# Patient Record
Sex: Female | Born: 1975 | Race: White | Hispanic: No | Marital: Married | Smoking: Current every day smoker
Health system: Southern US, Community
[De-identification: ages and names within clinical notes are randomized; demographics above are authoritative.]

## PROBLEM LIST (undated history)

## (undated) DIAGNOSIS — F419 Anxiety disorder, unspecified: Secondary | ICD-10-CM

## (undated) DIAGNOSIS — I10 Essential (primary) hypertension: Secondary | ICD-10-CM

## (undated) HISTORY — PX: GASTRIC BYPASS: SHX52

## (undated) HISTORY — PX: OVARIAN CYST REMOVAL: SHX89

---

## 2015-09-01 ENCOUNTER — Ambulatory Visit
Admission: RE | Admit: 2015-09-01 | Discharge: 2015-09-01 | Disposition: A | Discharge status: Home / Self Care | Payer: No Typology Code available for payment source | Source: Ambulatory Visit | Attending: *Deleted | Admitting: *Deleted

## 2015-09-01 ENCOUNTER — Other Ambulatory Visit: Payer: Self-pay | Admitting: *Deleted

## 2015-09-01 DIAGNOSIS — R7611 Nonspecific reaction to tuberculin skin test without active tuberculosis: Secondary | ICD-10-CM

## 2015-10-16 ENCOUNTER — Encounter (HOSPITAL_COMMUNITY): Payer: Self-pay | Admitting: Emergency Medicine

## 2015-10-16 ENCOUNTER — Inpatient Hospital Stay (HOSPITAL_COMMUNITY)
Admission: EM | Admit: 2015-10-16 | Discharge: 2015-10-20 | DRG: 418 | Disposition: A | Discharge status: Home / Self Care | Payer: BLUE CROSS/BLUE SHIELD | Attending: Internal Medicine | Admitting: Internal Medicine

## 2015-10-16 ENCOUNTER — Encounter (HOSPITAL_COMMUNITY): Payer: Self-pay

## 2015-10-16 ENCOUNTER — Ambulatory Visit (HOSPITAL_COMMUNITY)
Admission: EM | Admit: 2015-10-16 | Discharge: 2015-10-16 | Disposition: A | Discharge status: Nursing Home (Includes ICF) | Payer: BLUE CROSS/BLUE SHIELD

## 2015-10-16 DIAGNOSIS — K851 Biliary acute pancreatitis without necrosis or infection: Principal | ICD-10-CM | POA: Diagnosis present

## 2015-10-16 DIAGNOSIS — K8020 Calculus of gallbladder without cholecystitis without obstruction: Secondary | ICD-10-CM

## 2015-10-16 DIAGNOSIS — F17210 Nicotine dependence, cigarettes, uncomplicated: Secondary | ICD-10-CM | POA: Diagnosis present

## 2015-10-16 DIAGNOSIS — F329 Major depressive disorder, single episode, unspecified: Secondary | ICD-10-CM

## 2015-10-16 DIAGNOSIS — F32A Depression, unspecified: Secondary | ICD-10-CM

## 2015-10-16 DIAGNOSIS — Z881 Allergy status to other antibiotic agents status: Secondary | ICD-10-CM

## 2015-10-16 DIAGNOSIS — K810 Acute cholecystitis: Secondary | ICD-10-CM | POA: Diagnosis present

## 2015-10-16 DIAGNOSIS — E876 Hypokalemia: Secondary | ICD-10-CM | POA: Diagnosis present

## 2015-10-16 DIAGNOSIS — Z9884 Bariatric surgery status: Secondary | ICD-10-CM

## 2015-10-16 DIAGNOSIS — R109 Unspecified abdominal pain: Secondary | ICD-10-CM

## 2015-10-16 DIAGNOSIS — D649 Anemia, unspecified: Secondary | ICD-10-CM | POA: Diagnosis present

## 2015-10-16 DIAGNOSIS — R7401 Elevation of levels of liver transaminase levels: Secondary | ICD-10-CM

## 2015-10-16 DIAGNOSIS — R1011 Right upper quadrant pain: Secondary | ICD-10-CM

## 2015-10-16 DIAGNOSIS — Z79899 Other long term (current) drug therapy: Secondary | ICD-10-CM

## 2015-10-16 DIAGNOSIS — R740 Nonspecific elevation of levels of transaminase and lactic acid dehydrogenase [LDH]: Secondary | ICD-10-CM | POA: Diagnosis present

## 2015-10-16 DIAGNOSIS — K859 Acute pancreatitis without necrosis or infection, unspecified: Secondary | ICD-10-CM | POA: Diagnosis present

## 2015-10-16 DIAGNOSIS — Z419 Encounter for procedure for purposes other than remedying health state, unspecified: Secondary | ICD-10-CM

## 2015-10-16 DIAGNOSIS — Z880 Allergy status to penicillin: Secondary | ICD-10-CM

## 2015-10-16 DIAGNOSIS — Z720 Tobacco use: Secondary | ICD-10-CM

## 2015-10-16 DIAGNOSIS — F419 Anxiety disorder, unspecified: Secondary | ICD-10-CM | POA: Diagnosis present

## 2015-10-16 DIAGNOSIS — K858 Other acute pancreatitis without necrosis or infection: Secondary | ICD-10-CM

## 2015-10-16 HISTORY — DX: Anxiety disorder, unspecified: F41.9

## 2015-10-16 LAB — CBC
HCT: 40.7 % (ref 36.0–46.0)
HEMOGLOBIN: 13.4 g/dL (ref 12.0–15.0)
MCH: 32.3 pg (ref 26.0–34.0)
MCHC: 32.9 g/dL (ref 30.0–36.0)
MCV: 98.1 fL (ref 78.0–100.0)
PLATELETS: 213 10*3/uL (ref 150–400)
RBC: 4.15 MIL/uL (ref 3.87–5.11)
RDW: 15.3 % (ref 11.5–15.5)
WBC: 11.1 10*3/uL — ABNORMAL HIGH (ref 4.0–10.5)

## 2015-10-16 LAB — POCT URINALYSIS DIP (DEVICE)
GLUCOSE, UA: NEGATIVE mg/dL
Hgb urine dipstick: NEGATIVE
Ketones, ur: NEGATIVE mg/dL
Leukocytes, UA: NEGATIVE
Nitrite: NEGATIVE
PROTEIN: 30 mg/dL — AB
SPECIFIC GRAVITY, URINE: 1.02 (ref 1.005–1.030)
UROBILINOGEN UA: 2 mg/dL — AB (ref 0.0–1.0)
pH: 7.5 (ref 5.0–8.0)

## 2015-10-16 LAB — COMPREHENSIVE METABOLIC PANEL
ALBUMIN: 4.3 g/dL (ref 3.5–5.0)
ALK PHOS: 130 U/L — AB (ref 38–126)
ALT: 320 U/L — ABNORMAL HIGH (ref 14–54)
ANION GAP: 6 (ref 5–15)
AST: 224 U/L — ABNORMAL HIGH (ref 15–41)
BILIRUBIN TOTAL: 1.1 mg/dL (ref 0.3–1.2)
BUN: 9 mg/dL (ref 6–20)
CALCIUM: 9.1 mg/dL (ref 8.9–10.3)
CO2: 26 mmol/L (ref 22–32)
CREATININE: 0.71 mg/dL (ref 0.44–1.00)
Chloride: 100 mmol/L — ABNORMAL LOW (ref 101–111)
GFR calc non Af Amer: >60 mL/min (ref >60)
GLUCOSE: 106 mg/dL — AB (ref 65–99)
Potassium: 3.4 mmol/L — ABNORMAL LOW (ref 3.5–5.1)
Sodium: 132 mmol/L — ABNORMAL LOW (ref 135–145)
TOTAL PROTEIN: 7.3 g/dL (ref 6.5–8.1)

## 2015-10-16 LAB — URINALYSIS, ROUTINE W REFLEX MICROSCOPIC
BILIRUBIN URINE: NEGATIVE
Glucose, UA: NEGATIVE mg/dL
Hgb urine dipstick: NEGATIVE
KETONES UR: 15 mg/dL — AB
Leukocytes, UA: NEGATIVE
NITRITE: NEGATIVE
PROTEIN: NEGATIVE mg/dL
SPECIFIC GRAVITY, URINE: 1.016 (ref 1.005–1.030)
pH: 7 (ref 5.0–8.0)

## 2015-10-16 LAB — I-STAT BETA HCG BLOOD, ED (MC, WL, AP ONLY)

## 2015-10-16 LAB — LIPASE, BLOOD: Lipase: 1269 U/L — ABNORMAL HIGH (ref 11–51)

## 2015-10-16 NOTE — ED Triage Notes (Signed)
Pt complaining of mid abdominal pain and back pain. Pt states seen by UC today, sent here for gall bladder follow up. Pt states some nausea, no vomiting. Pt denies any urinary symptoms.

## 2015-10-16 NOTE — ED Triage Notes (Signed)
[facility] with a complaint of abdominal pain and lower back pain that started yesterday. The patient denied any dysuria or frequency.

## 2015-10-16 NOTE — ED Provider Notes (Signed)
[facility]    CSN: [id number] Arrival date & time: 10/16/15  1814  First Provider Contact:  First MD Initiated Contact with Patient 10/16/15 1904        History   Chief Complaint Chief Complaint  Patient presents with  . Abdominal Pain    HPI Anita Harrington is a 40 y.o. female.    Abdominal Pain  Pain location:  RUQ Pain quality: cramping   Pain radiates to:  Back Pain severity:  Moderate Duration:  2 days Progression:  Worsening Chronicity:  New Context: previous surgery   Relieved by:  None tried Worsened by:  Palpation Ineffective treatments:  None tried Associated symptoms: nausea   Associated symptoms: no belching, no dysuria and no fever     Past Medical History:  Diagnosis Date  . Anxiety     There are no active problems to display for this patient.   Past Surgical History:  Procedure Laterality Date  . GASTRIC BYPASS    . OVARIAN CYST REMOVAL      OB History    No data available       Home Medications    Prior to Admission medications   Medication Sig Start Date End Date Taking? Authorizing Provider  escitalopram (LEXAPRO) 20 MG tablet Take 20 mg by mouth daily.   Yes Historical Provider, MD  gabapentin (NEURONTIN) 300 MG capsule Take 300 mg by mouth 3 (three) times daily.   Yes Historical Provider, MD  progesterone (PROMETRIUM) 100 MG capsule Take 100 mg by mouth daily.   Yes Historical Provider, MD  traZODone (DESYREL) 100 MG tablet Take 200 mg by mouth at bedtime.   Yes Historical Provider, MD    Family History History reviewed. No pertinent family history.  Social History Social History  Substance Use Topics  . Smoking status: Current Every Day Smoker    Packs/day: 1.00    Years: 25.00  . Smokeless tobacco: Never Used  . Alcohol use No     Allergies   Levaquin [levofloxacin in d5w] and Penicillins   Review of Systems Review of Systems  Constitutional: Positive for appetite change. Negative for fever.    Respiratory: Negative.   Cardiovascular: Negative.   Gastrointestinal: Positive for abdominal pain and nausea.  Genitourinary: Negative.  Negative for dysuria.  All other systems reviewed and are negative.    Physical Exam Triage Vital Signs ED Triage Vitals  Enc Vitals Group     BP 10/16/15 1831 127/89     Pulse Rate 10/16/15 1831 82     Resp 10/16/15 1831 16     Temp 10/16/15 1831 98.1 F (36.7 C)     Temp Source 10/16/15 1831 Oral     SpO2 10/16/15 1831 100 %     Weight --      Height --      Head Circumference --      Peak Flow --      Pain Score 10/16/15 1849 7     Pain Loc --      Pain Edu? --      Excl. in GC? --    No data found.   Updated Vital Signs BP 127/89 (BP Location: Right Arm)   Pulse 82   Temp 98.1 F (36.7 C) (Oral)   Resp 16   LMP 10/09/2015 (Exact Date)   SpO2 100%   Visual Acuity Right Eye Distance:   Left Eye Distance:   Bilateral Distance:    Right Eye Near:  Left Eye Near:    Bilateral Near:     Physical Exam  Constitutional: She appears well-developed and well-nourished.  Cardiovascular: Normal rate, regular rhythm, normal heart sounds and intact distal pulses.   Pulmonary/Chest: Effort normal and breath sounds normal.  Abdominal: Bowel sounds are decreased. There is tenderness in the right upper quadrant. There is positive Murphy's sign. There is no rebound, no guarding and no CVA tenderness.    Nursing note and vitals reviewed.    UC Treatments / Results  Labs (all labs ordered are listed, but only abnormal results are displayed) Labs Reviewed  POCT URINALYSIS DIP (DEVICE) - Abnormal; Notable for the following:       Result Value   Bilirubin Urine SMALL (*)    Protein, ur 30 (*)    Urobilinogen, UA 2.0 (*)    All other components within normal limits    EKG  EKG Interpretation None       Radiology No results found.  Procedures Procedures (including critical care time)  Medications Ordered in  UC Medications - No data to display   Initial Impression / Assessment and Plan / UC Course  I have reviewed the triage vital signs and the nursing notes.  Pertinent labs & imaging results that were available during my care of the patient were reviewed by me and considered in my medical decision making (see chart for details).  Clinical Course   Sent for eval of acute ruq pain with radiation to right back, u/a neg   Final Clinical Impressions(s) / UC Diagnoses   Final diagnoses:  None    New Prescriptions New Prescriptions   No medications on file     Linna HoffJames D Kindl, MD 10/16/15 1931

## 2015-10-16 NOTE — ED Notes (Signed)
Called Pt for reassessment at 2215, no response

## 2015-10-17 ENCOUNTER — Encounter (HOSPITAL_COMMUNITY): Payer: Self-pay | Admitting: General Practice

## 2015-10-17 ENCOUNTER — Observation Stay (HOSPITAL_COMMUNITY): Payer: BLUE CROSS/BLUE SHIELD

## 2015-10-17 ENCOUNTER — Emergency Department (HOSPITAL_COMMUNITY): Payer: BLUE CROSS/BLUE SHIELD

## 2015-10-17 DIAGNOSIS — K859 Acute pancreatitis without necrosis or infection, unspecified: Secondary | ICD-10-CM | POA: Diagnosis present

## 2015-10-17 DIAGNOSIS — E876 Hypokalemia: Secondary | ICD-10-CM | POA: Diagnosis present

## 2015-10-17 DIAGNOSIS — R740 Nonspecific elevation of levels of transaminase and lactic acid dehydrogenase [LDH]: Secondary | ICD-10-CM

## 2015-10-17 DIAGNOSIS — K858 Other acute pancreatitis without necrosis or infection: Secondary | ICD-10-CM | POA: Diagnosis not present

## 2015-10-17 DIAGNOSIS — K8020 Calculus of gallbladder without cholecystitis without obstruction: Secondary | ICD-10-CM | POA: Diagnosis present

## 2015-10-17 DIAGNOSIS — F419 Anxiety disorder, unspecified: Secondary | ICD-10-CM | POA: Diagnosis present

## 2015-10-17 DIAGNOSIS — Z720 Tobacco use: Secondary | ICD-10-CM

## 2015-10-17 DIAGNOSIS — Z881 Allergy status to other antibiotic agents status: Secondary | ICD-10-CM | POA: Diagnosis not present

## 2015-10-17 DIAGNOSIS — F329 Major depressive disorder, single episode, unspecified: Secondary | ICD-10-CM | POA: Diagnosis present

## 2015-10-17 DIAGNOSIS — R7401 Elevation of levels of liver transaminase levels: Secondary | ICD-10-CM

## 2015-10-17 DIAGNOSIS — D649 Anemia, unspecified: Secondary | ICD-10-CM | POA: Diagnosis present

## 2015-10-17 DIAGNOSIS — Z9884 Bariatric surgery status: Secondary | ICD-10-CM | POA: Diagnosis not present

## 2015-10-17 DIAGNOSIS — Z79899 Other long term (current) drug therapy: Secondary | ICD-10-CM | POA: Diagnosis not present

## 2015-10-17 DIAGNOSIS — K851 Biliary acute pancreatitis without necrosis or infection: Secondary | ICD-10-CM | POA: Diagnosis present

## 2015-10-17 DIAGNOSIS — Z880 Allergy status to penicillin: Secondary | ICD-10-CM | POA: Diagnosis not present

## 2015-10-17 DIAGNOSIS — F17210 Nicotine dependence, cigarettes, uncomplicated: Secondary | ICD-10-CM | POA: Diagnosis present

## 2015-10-17 DIAGNOSIS — K810 Acute cholecystitis: Secondary | ICD-10-CM | POA: Diagnosis present

## 2015-10-17 DIAGNOSIS — F32A Depression, unspecified: Secondary | ICD-10-CM

## 2015-10-17 LAB — BASIC METABOLIC PANEL
ANION GAP: 7 (ref 5–15)
BUN: 9 mg/dL (ref 6–20)
CHLORIDE: 104 mmol/L (ref 101–111)
CO2: 25 mmol/L (ref 22–32)
Calcium: 8.8 mg/dL — ABNORMAL LOW (ref 8.9–10.3)
Creatinine, Ser: 0.62 mg/dL (ref 0.44–1.00)
GFR calc Af Amer: >60 mL/min (ref >60)
GLUCOSE: 112 mg/dL — AB (ref 65–99)
POTASSIUM: 3.3 mmol/L — AB (ref 3.5–5.1)
Sodium: 136 mmol/L (ref 135–145)

## 2015-10-17 LAB — CBC
HEMATOCRIT: 35.2 % — AB (ref 36.0–46.0)
HEMOGLOBIN: 11.3 g/dL — AB (ref 12.0–15.0)
MCH: 31.5 pg (ref 26.0–34.0)
MCHC: 32.1 g/dL (ref 30.0–36.0)
MCV: 98.1 fL (ref 78.0–100.0)
Platelets: 155 10*3/uL (ref 150–400)
RBC: 3.59 MIL/uL — ABNORMAL LOW (ref 3.87–5.11)
RDW: 15.6 % — ABNORMAL HIGH (ref 11.5–15.5)
WBC: 8 10*3/uL (ref 4.0–10.5)

## 2015-10-17 LAB — HEPATIC FUNCTION PANEL
ALBUMIN: 3.5 g/dL (ref 3.5–5.0)
ALK PHOS: 106 U/L (ref 38–126)
ALT: 203 U/L — ABNORMAL HIGH (ref 14–54)
AST: 102 U/L — AB (ref 15–41)
BILIRUBIN TOTAL: 0.7 mg/dL (ref 0.3–1.2)
Bilirubin, Direct: 0.2 mg/dL (ref 0.1–0.5)
Indirect Bilirubin: 0.5 mg/dL (ref 0.3–0.9)
Total Protein: 5.9 g/dL — ABNORMAL LOW (ref 6.5–8.1)

## 2015-10-17 LAB — LIPASE, BLOOD: LIPASE: 750 U/L — AB (ref 11–51)

## 2015-10-17 MED ORDER — SODIUM CHLORIDE 0.9 % IV BOLUS (SEPSIS)
1000.0000 mL | Freq: Once | INTRAVENOUS | Status: AC
  Administered 2015-10-17: 1000 mL via INTRAVENOUS

## 2015-10-17 MED ORDER — ESCITALOPRAM OXALATE 10 MG PO TABS
20.0000 mg | ORAL_TABLET | Freq: Every day | ORAL | Status: DC
  Administered 2015-10-17 – 2015-10-20 (×4): 20 mg via ORAL
  Filled 2015-10-17 (×4): qty 2

## 2015-10-17 MED ORDER — SODIUM CHLORIDE 0.9 % IV SOLN
INTRAVENOUS | Status: AC
  Administered 2015-10-17 – 2015-10-18 (×2): 1000 mL via INTRAVENOUS

## 2015-10-17 MED ORDER — ACETAMINOPHEN 650 MG RE SUPP: 650.0000 mg | Freq: Four times a day (QID) | RECTAL | Status: DC | PRN

## 2015-10-17 MED ORDER — MORPHINE SULFATE (PF) 4 MG/ML IV SOLN
4.0000 mg | INTRAVENOUS | Status: DC | PRN
  Administered 2015-10-17 – 2015-10-18 (×6): 4 mg via INTRAVENOUS
  Filled 2015-10-17 (×11): qty 1

## 2015-10-17 MED ORDER — MORPHINE SULFATE (PF) 4 MG/ML IV SOLN
4.0000 mg | Freq: Once | INTRAVENOUS | Status: AC
  Administered 2015-10-17: 4 mg via INTRAVENOUS
  Filled 2015-10-17: qty 1

## 2015-10-17 MED ORDER — ONDANSETRON HCL 4 MG/2ML IJ SOLN: 4.0000 mg | Freq: Four times a day (QID) | INTRAMUSCULAR | Status: DC | PRN

## 2015-10-17 MED ORDER — ONDANSETRON HCL 4 MG PO TABS: 4.0000 mg | ORAL_TABLET | Freq: Four times a day (QID) | ORAL | Status: DC | PRN

## 2015-10-17 MED ORDER — ONDANSETRON HCL 4 MG/2ML IJ SOLN
4.0000 mg | Freq: Once | INTRAMUSCULAR | Status: AC
  Administered 2015-10-17: 4 mg via INTRAVENOUS
  Filled 2015-10-17: qty 2

## 2015-10-17 MED ORDER — NICOTINE 21 MG/24HR TD PT24
21.0000 mg | MEDICATED_PATCH | Freq: Once | TRANSDERMAL | Status: AC
  Administered 2015-10-17: 21 mg via TRANSDERMAL
  Filled 2015-10-17: qty 1

## 2015-10-17 MED ORDER — TRAZODONE HCL 100 MG PO TABS
200.0000 mg | ORAL_TABLET | Freq: Every day | ORAL | Status: DC
  Administered 2015-10-17 – 2015-10-20 (×3): 200 mg via ORAL
  Filled 2015-10-17 (×5): qty 2

## 2015-10-17 MED ORDER — ACETAMINOPHEN 10 MG/ML IV SOLN
1000.0000 mg | Freq: Once | INTRAVENOUS | Status: AC
  Administered 2015-10-17: 1000 mg via INTRAVENOUS
  Filled 2015-10-17: qty 100

## 2015-10-17 MED ORDER — MORPHINE SULFATE (PF) 2 MG/ML IV SOLN
2.0000 mg | INTRAVENOUS | Status: DC | PRN
  Administered 2015-10-17 (×2): 2 mg via INTRAVENOUS
  Filled 2015-10-17 (×2): qty 1

## 2015-10-17 MED ORDER — SODIUM CHLORIDE 0.9 % IV SOLN
500.0000 mg | Freq: Four times a day (QID) | INTRAVENOUS | Status: DC
  Filled 2015-10-17 (×2): qty 500

## 2015-10-17 MED ORDER — GABAPENTIN 300 MG PO CAPS
300.0000 mg | ORAL_CAPSULE | Freq: Three times a day (TID) | ORAL | Status: DC
  Administered 2015-10-17 – 2015-10-20 (×11): 300 mg via ORAL
  Filled 2015-10-17 (×11): qty 1

## 2015-10-17 MED ORDER — ACETAMINOPHEN 325 MG PO TABS
650.0000 mg | ORAL_TABLET | Freq: Four times a day (QID) | ORAL | Status: DC | PRN
  Administered 2015-10-17 – 2015-10-20 (×5): 650 mg via ORAL
  Filled 2015-10-17 (×5): qty 2

## 2015-10-17 MED ORDER — SODIUM CHLORIDE 0.9 % IV SOLN
500.0000 mg | Freq: Four times a day (QID) | INTRAVENOUS | Status: DC
  Administered 2015-10-17 – 2015-10-20 (×11): 500 mg via INTRAVENOUS
  Filled 2015-10-17 (×15): qty 500

## 2015-10-17 NOTE — Progress Notes (Signed)
Pt is not getting relief from 2 mg of morphine, she was given 4 mg in ED and that lasted about 2.5 hrs her pain is back to 8-9 on 0-10 scale. Will give this information to on coming day nurse.

## 2015-10-17 NOTE — Progress Notes (Addendum)
Pharmacy Antibiotic Note  Anita Harrington is a 40 y.o. female admitted on 10/16/2015 with abdominal pain x1 day. Empiric Primaxin for intra-abdominal infx/poss. pancreatitis. Lipase elevated. Gallstones noted on abdominal US. MRCP today. Pt currently afebrile, WBC WNL.   Allergy to PCN noted (rash). Clarified with pt - occurred in childhood. Does not recall symptoms of anaphylaxis.    Plan: -Start Primaxin 500mg  q6h  -F/U GI workup -Monitor renal fcn, clinical status, LOT   Height: 5\' 2"  (157.5 cm) Weight: 136 lb 3.9 oz (61.8 kg) IBW/kg (Calculated) : 50.1  Temp (24hrs), Avg:98.3 F (36.8 C), Min:98.1 F (36.7 C), Max:98.5 F (36.9 C)   Recent Labs Lab 10/16/15 2001 10/17/15 0655  WBC 11.1* 8.0  CREATININE 0.71 0.62    Estimated Creatinine Clearance: 80.9 mL/min (by C-G formula based on SCr of 0.8 mg/dL).    Allergies  Allergen Reactions  . Levaquin [Levofloxacin In D5w] Rash    During childhood. Pt unsure of details.  . Penicillins Rash    During childhood. Pt unsure of details.    Antimicrobials this admission: Primaxin 8/22 >>  Thank you for allowing pharmacy to be a part of this patient's care.  Sherle Poeob Rhyder Koegel, PharmD Clinical Pharmacist 8:42 AM, 10/17/2015

## 2015-10-17 NOTE — H&P (Addendum)
History and Physical    Anita Harrington [id number] DOB: Nov 22, 1975 DOA: 10/16/2015  PCP: No PCP Per Patient  Patient coming from: Home.  Chief Complaint: Abdominal pain.  HPI: Anita Harrington is a 40 y.o. female with depression presents to the ER because of abdominal pain. Patient's abdominal pain started night before this. Has been having some nausea denies any vomiting or diarrhea. Denies any fever or chills. Labs in the ER showed elevated lipase more than 1200 and LFTs elevated. Sonogram shows gallstones. Patient is being admitted for gallstone pancreatitis. Denies any alcohol abuse.   ED Course: Was started on IV fluids. Sonogram shows gallstones.  Review of Systems: As per HPI, rest all negative.   Past Medical History:  Diagnosis Date  . Anxiety     Past Surgical History:  Procedure Laterality Date  . GASTRIC BYPASS    . OVARIAN CYST REMOVAL       reports that she has been smoking.  She has a 25.00 pack-year smoking history. She has never used smokeless tobacco. She reports that she does not drink alcohol or use drugs.  Allergies  Allergen Reactions  . Levaquin [Levofloxacin In D5w] Rash  . Penicillins Rash    Family History  Problem Relation Age of Onset  . Diabetes Mother   . Diabetes Father     Prior to Admission medications   Medication Sig Start Date End Date Taking? Authorizing Provider  escitalopram (LEXAPRO) 20 MG tablet Take 20 mg by mouth daily.   Yes Historical Provider, MD  gabapentin (NEURONTIN) 300 MG capsule Take 300 mg by mouth 3 (three) times daily.   Yes Historical Provider, MD  Multiple Vitamin (MULTIVITAMIN WITH MINERALS) TABS tablet Take 1 tablet by mouth daily.   Yes Historical Provider, MD  progesterone (PROMETRIUM) 100 MG capsule Take 100 mg by mouth daily.   Yes Historical Provider, MD  traZODone (DESYREL) 100 MG tablet Take 200 mg by mouth at bedtime.   Yes Historical Provider, MD    Physical Exam: Vitals:   10/17/15 0030  10/17/15 0130 10/17/15 0200 10/17/15 0307  BP: 130/83 129/72 138/81 129/74  Pulse: 88 79 68 65  Resp:    20  Temp:    98.5 F (36.9 C)  TempSrc:    Oral  SpO2: 100% 99% 100% 100%  Weight:    136 lb 3.9 oz (61.8 kg)  Height:    5\' 2"  (1.575 m)      Constitutional: Not in distress. Vitals:   10/17/15 0030 10/17/15 0130 10/17/15 0200 10/17/15 0307  BP: 130/83 129/72 138/81 129/74  Pulse: 88 79 68 65  Resp:    20  Temp:    98.5 F (36.9 C)  TempSrc:    Oral  SpO2: 100% 99% 100% 100%  Weight:    136 lb 3.9 oz (61.8 kg)  Height:    5\' 2"  (1.575 m)   Eyes: Anicteric no pallor. ENMT: No discharge from the ears eyes nose and mouth. Neck: No mass felt. No neck rigidity. Respiratory: No rhonchi or crepitations. Cardiovascular: S1-S2 heard. Abdomen: Mild epigastric tenderness no guarding or rigidity. Musculoskeletal: No edema. Skin: No rash. Neurologic: Alert awake oriented to time place and person. Moves all extremities. Psychiatric: Appears normal.   Labs on Admission: I have personally reviewed following labs and imaging studies  CBC:  Recent Labs Lab 10/16/15 2001  WBC 11.1*  HGB 13.4  HCT 40.7  MCV 98.1  PLT 213   Basic Metabolic Panel:  Recent Labs  Lab 10/16/15 2001  NA 132*  K 3.4*  CL 100*  CO2 26  GLUCOSE 106*  BUN 9  CREATININE 0.71  CALCIUM 9.1   GFR: Estimated Creatinine Clearance: 80.9 mL/min (by C-G formula based on SCr of 0.8 mg/dL). Liver Function Tests:  Recent Labs Lab 10/16/15 2001  AST 224*  ALT 320*  ALKPHOS 130*  BILITOT 1.1  PROT 7.3  ALBUMIN 4.3    Recent Labs Lab 10/16/15 2001  LIPASE 1,269*   No results for input(s): AMMONIA in the last 168 hours. Coagulation Profile: No results for input(s): INR, PROTIME in the last 168 hours. Cardiac Enzymes: No results for input(s): CKTOTAL, CKMB, CKMBINDEX, TROPONINI in the last 168 hours. BNP (last 3 results) No results for input(s): PROBNP in the last 8760  hours. HbA1C: No results for input(s): HGBA1C in the last 72 hours. CBG: No results for input(s): GLUCAP in the last 168 hours. Lipid Profile: No results for input(s): CHOL, HDL, LDLCALC, TRIG, CHOLHDL, LDLDIRECT in the last 72 hours. Thyroid Function Tests: No results for input(s): TSH, T4TOTAL, FREET4, T3FREE, THYROIDAB in the last 72 hours. Anemia Panel: No results for input(s): VITAMINB12, FOLATE, FERRITIN, TIBC, IRON, RETICCTPCT in the last 72 hours. Urine analysis:    Component Value Date/Time   COLORURINE YELLOW 10/16/2015 2121   APPEARANCEUR CLOUDY (A) 10/16/2015 2121   LABSPEC 1.016 10/16/2015 2121   PHURINE 7.0 10/16/2015 2121   GLUCOSEU NEGATIVE 10/16/2015 2121   HGBUR NEGATIVE 10/16/2015 2121   BILIRUBINUR NEGATIVE 10/16/2015 2121   KETONESUR 15 (A) 10/16/2015 2121   PROTEINUR NEGATIVE 10/16/2015 2121   UROBILINOGEN 2.0 (H) 10/16/2015 1853   NITRITE NEGATIVE 10/16/2015 2121   LEUKOCYTESUR NEGATIVE 10/16/2015 2121   Sepsis Labs: @LABRCNTIP (procalcitonin:4,lacticidven:4) )No results found for this or any previous visit (from the past 240 hour(s)).   Radiological Exams on Admission: [place] Abdomen Limited Ruq  Result Date: 10/17/2015 CLINICAL DATA:  Right upper quadrant abdominal pain EXAM: US ABDOMEN LIMITED - RIGHT UPPER QUADRANT COMPARISON:  None. FINDINGS: Gallbladder: Multiple shadowing gallstones. Gallbladder reportedly tender but there is partial distention and no wall thickening or pericholecystic edema Common bile duct: Diameter: 3 mm.  Where visualized, no filling defect. Liver: No focal lesion identified. Within normal limits in parenchymal echogenicity. IMPRESSION: Cholelithiasis. The gallbladder is tender but there is no distension or wall inflammation to suggest acute cholecystitis. Electronically Signed   By: Marnee SpringJonathon  Watts M.D.   On: 10/17/2015 01:30     Assessment/Plan Principal Problem:   Gallstone pancreatitis    1. Acute gallstone pancreatitis -  for now active patient nothing by mouth except medications and pain relief medications. Gently hydrate. I have ordered an MRCP to look for any obstructing stone or lesions. Consult gastroenterologist and surgery in a.m. For now I have placed patient on empiric antibiotics. Follow LFTs. 2. History of depression - continue present medications. 3. Tobacco abuse - tobacco cessation counseling requested.   DVT prophylaxis: SCDs. Code Status: Full code.  Family Communication: Discussed with patient.  Disposition Plan: Home.  Consults called: None.  Admission status: Observation.    Eduard ClosKAKRAKANDY,Rubin Dais N. MD [facility] Pager [phone].  If 7PM-7AM, please contact night-coverage [email or web address] Password [email or web address]  10/17/2015, 4:23 AM

## 2015-10-17 NOTE — Progress Notes (Signed)
Patient is upset regarding her POC. I did my best to update and inform her of today POC. Patient requesting  to see MD to discuss her pain regimen because morphine is not working. Rn reupdate POC. Attending MD is on his way to see pt. RN called MRI called and will transport pt to MRI soon.    Sim BoastHavy, RN

## 2015-10-17 NOTE — ED Notes (Signed)
Patient alert and oriented x4. Patient ambulatory. C/o right upper quadrant abdomen pain. 9/10 intermittant. Brooke Charity fundraiserN, [phone]

## 2015-10-17 NOTE — Progress Notes (Addendum)
Pt requesting to leave AMA notified [facility] attending of patients request am awaiting call back. Received call back was advised to allow patient to leave AMA with full understanding of risk vs benefits, it is undetermined at this time what patient will do. I will continue note after further assessing situation.   Patient has not brought the subject of leaving AMA up the last three times I have been in the room, I will leave it at that at this time.

## 2015-10-17 NOTE — Progress Notes (Signed)
Pt arrived around 0300 still having abdominal pain 7 on 0 -10 scale but otherwise appears normal with upper right quadrant abdominal pain on palpation, active bowel sounds all quadrants passing gas and had a bowel movement 10/16/15. Will continue to monitor.

## 2015-10-17 NOTE — Progress Notes (Signed)
Pt takes 100 mg of progesterone daily and she was concerned about getting this medication while in the hospital.

## 2015-10-17 NOTE — Consult Note (Signed)
[facility] Surgery Consult Note  Anita Harrington [birth year]  [id number].    Requesting MD: Dr. Shanon Brow Tat  Chief Complaint/Reason for Consult: Gallstone pancreatitis  HPI:  40 y/o female with PMH gastric bypass, anxiety, depression, polysubstance abuse previously on suboxone 10 years ago who presented to [facility] late 10/16/15 with 24h of abdominal pain associated with nausea. RUQ U/S in ED significant for cholelithiasis without biliary obstruction or dilation of CBD. Lipase at time of admission 1,269. AST/ALT 224/320. Alk Phos 130. T.bili is normal. WBC 11.1. Patient was started on IV abx, IVF, and admitted. MRCP negative for choledocholithiasis. General surgery was asked to consult.  Patient reports her pain started as back pain and later radiated to her LUQ and epigastrium. She denies fever, chills, CP, SOB, palpitations, vomiting, diarrhea, melena and hematochezia. She also complains of a sharp frontal HA. Past abdominal surgeries include ovarian cyst removal and gastric bypass surgery (5 years ago). Patient is frustrated about the way she has been treated thus far in her hospital stay based on her PMH polysubstance abuse. She denies use of narcotic medications, alcohol, and IV drugs. She smokes cigarettes. Her goal is to receive necessary treatment for her pancreatitis and then be able to be discharged from the hospital without narcotic medications. Reports that in order to finish her last 3 weeks of treatment at [facility] she cannot be on any narcotic medications. She denies use of blood thinning medications.  Hepatic Function Latest Ref Rng & Units 10/17/2015 10/16/2015  Total Protein 6.5 - 8.1 g/dL 5.9(L) 7.3  Albumin 3.5 - 5.0 g/dL 3.5 4.3  AST 15 - 41 U/L 102(H) 224(H)  ALT 14 - 54 U/L 203(H) 320(H)  Alk Phosphatase 38 - 126 U/L 106 130(H)  Total Bilirubin 0.3 - 1.2 mg/dL 0.7 1.1  Bilirubin, Direct 0.1 - 0.5 mg/dL 0.2 -   ROS: All systems reviewed and otherwise negative except for  as above  Family History  Problem Relation Age of Onset  . Diabetes Mother   . Diabetes Father     Past Medical History:  Diagnosis Date  . Anxiety     Past Surgical History:  Procedure Laterality Date  . GASTRIC BYPASS    . OVARIAN CYST REMOVAL      Social History:  reports that she has been smoking.  She has a 25.00 pack-year smoking history. She has never used smokeless tobacco. She reports that she does not drink alcohol or use drugs.  Allergies:  Allergies  Allergen Reactions  . Levaquin [Levofloxacin In D5w] Rash    During childhood. Pt unsure of details.  . Penicillins Rash    During childhood. Pt unsure of details.    Medications Prior to Admission  Medication Sig Dispense Refill  . escitalopram (LEXAPRO) 20 MG tablet Take 20 mg by mouth daily.    Marland Kitchen gabapentin (NEURONTIN) 300 MG capsule Take 300 mg by mouth 3 (three) times daily.    . Multiple Vitamin (MULTIVITAMIN WITH MINERALS) TABS tablet Take 1 tablet by mouth daily.    . progesterone (PROMETRIUM) 100 MG capsule Take 100 mg by mouth daily.    . traZODone (DESYREL) 100 MG tablet Take 200 mg by mouth at bedtime.      Blood pressure 122/63, pulse 62, temperature 98.4 F (36.9 C), temperature source Oral, resp. rate 20, height '5\' 2"'$  (1.575 m), weight 61.8 kg (136 lb 3.9 oz), last menstrual period 10/09/2015, SpO2 100 %. Physical Exam: General: pleasant, overweight white female who is laying  in bed in NAD HEENT: head is normocephalic, atraumatic. Heart: regular, rate, and rhythm.  No obvious murmurs, gallops, or rubs noted.  Palpable pedal pulses bilaterally Lungs: CTAB, no wheezes, rhonchi, or rales noted.  Respiratory effort nonlabored Abd: soft, TTP of epigastrium and LUQ - with brief inspiratory arrest and wincing on distracted exam, ND, +BS, no masses, hernias, or organomegaly MS: all 4 extremities are symmetrical with no cyanosis, clubbing, or edema. Skin: warm and dry with no masses, lesions, or  rashes Psych: appropriate affect. Neuro: A&Ox3, normal speech  Results for orders placed or performed during the hospital encounter of 10/16/15 (from the past 48 hour(s))  Lipase, blood     Status: Abnormal   Collection Time: 10/16/15  8:01 PM  Result Value Ref Range   Lipase 1,269 (H) 11 - 51 U/L    Comment: RESULTS CONFIRMED BY MANUAL DILUTION  Comprehensive metabolic panel     Status: Abnormal   Collection Time: 10/16/15  8:01 PM  Result Value Ref Range   Sodium 132 (L) 135 - 145 mmol/L   Potassium 3.4 (L) 3.5 - 5.1 mmol/L   Chloride 100 (L) 101 - 111 mmol/L   CO2 26 22 - 32 mmol/L   Glucose, Bld 106 (H) 65 - 99 mg/dL   BUN 9 6 - 20 mg/dL   Creatinine, Ser 0.71 0.44 - 1.00 mg/dL   Calcium 9.1 8.9 - 10.3 mg/dL   Total Protein 7.3 6.5 - 8.1 g/dL   Albumin 4.3 3.5 - 5.0 g/dL   AST 224 (H) 15 - 41 U/L   ALT 320 (H) 14 - 54 U/L   Alkaline Phosphatase 130 (H) 38 - 126 U/L   Total Bilirubin 1.1 0.3 - 1.2 mg/dL   GFR calc non Af Amer >60 >60 mL/min   GFR calc Af Amer >60 >60 mL/min    Comment: (NOTE) The eGFR has been calculated using the CKD EPI equation. This calculation has not been validated in all clinical situations. eGFR's persistently <60 mL/min signify possible Chronic Kidney Disease.    Anion gap 6 5 - 15  CBC     Status: Abnormal   Collection Time: 10/16/15  8:01 PM  Result Value Ref Range   WBC 11.1 (H) 4.0 - 10.5 K/uL   RBC 4.15 3.87 - 5.11 MIL/uL   Hemoglobin 13.4 12.0 - 15.0 g/dL   HCT 40.7 36.0 - 46.0 %   MCV 98.1 78.0 - 100.0 fL   MCH 32.3 26.0 - 34.0 pg   MCHC 32.9 30.0 - 36.0 g/dL   RDW 15.3 11.5 - 15.5 %   Platelets 213 150 - 400 K/uL  I-Stat Beta hCG blood, ED ([facility], [facility], [facility] only)     Status: None   Collection Time: 10/16/15  8:19 PM  Result Value Ref Range   I-stat hCG, quantitative <5.0 <5 mIU/mL   Comment 3            Comment:   GEST. AGE      CONC.  (mIU/mL)   <=1 WEEK        5 - 50     2 WEEKS       50 - 500     3 WEEKS       100 - 10,000      4 WEEKS     1,000 - 30,000        FEMALE AND NON-PREGNANT FEMALE:     LESS THAN 5 mIU/mL   Urinalysis, Routine  w reflex microscopic     Status: Abnormal   Collection Time: 10/16/15  9:21 PM  Result Value Ref Range   Color, Urine YELLOW YELLOW   APPearance CLOUDY (A) CLEAR   Specific Gravity, Urine 1.016 1.005 - 1.030   pH 7.0 5.0 - 8.0   Glucose, UA NEGATIVE NEGATIVE mg/dL   Hgb urine dipstick NEGATIVE NEGATIVE   Bilirubin Urine NEGATIVE NEGATIVE   Ketones, ur 15 (A) NEGATIVE mg/dL   Protein, ur NEGATIVE NEGATIVE mg/dL   Nitrite NEGATIVE NEGATIVE   Leukocytes, UA NEGATIVE NEGATIVE    Comment: MICROSCOPIC NOT DONE ON URINES WITH NEGATIVE PROTEIN, BLOOD, LEUKOCYTES, NITRITE, OR GLUCOSE <1000 mg/dL.  Basic metabolic panel     Status: Abnormal   Collection Time: 10/17/15  6:55 AM  Result Value Ref Range   Sodium 136 135 - 145 mmol/L   Potassium 3.3 (L) 3.5 - 5.1 mmol/L   Chloride 104 101 - 111 mmol/L   CO2 25 22 - 32 mmol/L   Glucose, Bld 112 (H) 65 - 99 mg/dL   BUN 9 6 - 20 mg/dL   Creatinine, Ser 0.62 0.44 - 1.00 mg/dL   Calcium 8.8 (L) 8.9 - 10.3 mg/dL   GFR calc non Af Amer >60 >60 mL/min   GFR calc Af Amer >60 >60 mL/min    Comment: (NOTE) The eGFR has been calculated using the CKD EPI equation. This calculation has not been validated in all clinical situations. eGFR's persistently <60 mL/min signify possible Chronic Kidney Disease.    Anion gap 7 5 - 15  CBC     Status: Abnormal   Collection Time: 10/17/15  6:55 AM  Result Value Ref Range   WBC 8.0 4.0 - 10.5 K/uL   RBC 3.59 (L) 3.87 - 5.11 MIL/uL   Hemoglobin 11.3 (L) 12.0 - 15.0 g/dL   HCT 35.2 (L) 36.0 - 46.0 %   MCV 98.1 78.0 - 100.0 fL   MCH 31.5 26.0 - 34.0 pg   MCHC 32.1 30.0 - 36.0 g/dL   RDW 15.6 (H) 11.5 - 15.5 %   Platelets 155 150 - 400 K/uL  Hepatic function panel     Status: Abnormal   Collection Time: 10/17/15  6:55 AM  Result Value Ref Range   Total Protein 5.9 (L) 6.5 - 8.1 g/dL   Albumin 3.5  3.5 - 5.0 g/dL   AST 102 (H) 15 - 41 U/L   ALT 203 (H) 14 - 54 U/L   Alkaline Phosphatase 106 38 - 126 U/L   Total Bilirubin 0.7 0.3 - 1.2 mg/dL   Bilirubin, Direct 0.2 0.1 - 0.5 mg/dL   Indirect Bilirubin 0.5 0.3 - 0.9 mg/dL   US Abdomen Limited Ruq  Result Date: 10/17/2015 CLINICAL DATA:  Right upper quadrant abdominal pain EXAM: US ABDOMEN LIMITED - RIGHT UPPER QUADRANT COMPARISON:  None. FINDINGS: Gallbladder: Multiple shadowing gallstones. Gallbladder reportedly tender but there is partial distention and no wall thickening or pericholecystic edema Common bile duct: Diameter: 3 mm.  Where visualized, no filling defect. Liver: No focal lesion identified. Within normal limits in parenchymal echogenicity. IMPRESSION: Cholelithiasis. The gallbladder is tender but there is no distension or wall inflammation to suggest acute cholecystitis. Electronically Signed   By: Monte Fantasia M.D.   On: 10/17/2015 01:30   Assessment/Plan Gallstone pancreatitis  Transaminitis  - RUQ U/S: cholelithiasis without evidence of cholecystitis - MRCP negative for choledocholithiasis - NPO, IVF, abx - LFT's trending down, leukocytosis resolved, repeat lipase ordered  -  I have ordered one dose of IV tylenol for patients pain/HA; patient feels morphine is not improving her pain - consider switching to dilaudid.  Anxiety/Depression - PMH SI; continue home meds; on Neurontin for anxiety to avoid use of BZDs History of polysubstance abuse - has not abused narcotics in 10-15 years; alcohol abuse 3 years ago and still receiving treatment. Note from Dr. [place] Crane 02/03/15 reviewed.  Tobacco abuse  - encourage cessation  FEN: NPO, IVF  ID: Imipenem 10/17/15 >> Plan: NPO, IVF, and abdominal exams. Repeat lipase pending. Will plan for laparoscopic cholecystectomy when abdominal tenderness decreases.   Thank you for this consult.  Jill Alexanders, [facility] Surgery 10/17/2015, 11:27 AM Pager:  [phone] Consults: [phone] Mon-Fri 7:00 am-4:30 pm Sat-Sun 7:00 am-11:30 am

## 2015-10-17 NOTE — Progress Notes (Signed)
Initial Nutrition Assessment   INTERVENTION:  Diet advancement per MD Add supplements as needed based on diet order and PO tolerance Provide Multivitamin with minerals daily   NUTRITION DIAGNOSIS:   Predicted suboptimal nutrient intake related to acute illness, poor appetite as evidenced by per patient/family report, NPO status.   GOAL:   Patient will meet greater than or equal to 90% of their needs   MONITOR:   Diet advancement, PO intake, Labs, Weight trends, I & O's  REASON FOR ASSESSMENT:   Malnutrition Screening Tool    ASSESSMENT:   40 y/o female with PMH gastric bypass, anxiety, depression, polysubstance abuse previously on suboxone 10 years ago who [facility] with 24h of abdominal pain associated with nausea. RUQ U/S in ED significant for cholelithiasis without biliary obstruction or dilation of CBD. Lipase at time of admission 1,269. AST/ALT 224/320.  Pt reports having a decreased appetite for the past week and eating 25% less than usual. She reports that prior to gastric bypass (5 years ago) she weighed 230 lbs, she lost down to 105 lbs, but more recently she has maintained 130 to 138 lbs. She thinks she has lost 1-2 lbs in the past week. She states that she usually eats small portions due to hx of gastric bypass. She stopped drinking protein shakes a few months ago and only occasionally takes any vitamin supplements. She reports pain as moderate at time of visit and appetite as good.  RD encouraged patient to take a daily multivitamin with iron, daily vitamin B-12 supplements, and to focus on eating adequate protein daily. RD will monitor diet advancement and PO tolerance. Pt is agreeable to supplements PRN.  Pt appears well-nourished per nutrition-focused physical exam.   Labs: low hemoglobin, high AST/ALT, low potassium  Diet Order:  Diet NPO time specified Except for: Sips with Meds  Skin:  Reviewed, no issues  Last BM:  8/21  Height:    Ht Readings from Last 1 Encounters:  10/17/15 5\' 2"  (1.575 m)    Weight:   Wt Readings from Last 1 Encounters:  10/17/15 136 lb 3.9 oz (61.8 kg)    Ideal Body Weight:  50 kg  BMI:  Body mass index is 24.92 kg/m.  Estimated Nutritional Needs:   Kcal:  1500-1750  Protein:  75-85 grams  Fluid:  1.8 L/day  EDUCATION NEEDS:   No education needs identified at this time  Dorothea Ogleeanne Cody Oliger RD, LDN, CSP Inpatient Clinical Dietitian Pager: [phone] After Hours Pager: [phone]

## 2015-10-17 NOTE — Progress Notes (Addendum)
PROGRESS NOTE  Anita BeckStacey Harrington ONG:[id number] DOB: [birth year] DOA: 10/16/2015 PCP: No PCP Per Patient  Brief History:  40 year old female with a history of gastric bypass, anxiety, depression, polysubstance abuse previously on suboxone 10 years ago presented with one-day history of abdominal pain in epigastric and right upper quadrant area. She has some nausea without emesis. She denied any fevers, chills, chest pain, shortness breath, diarrhea. Right upper quadrant ultrasound on 10/16/2015 showed cholelithiasis without biliary ductal dilatation. The patient was started on intravenous antibiotics and intravenous fluids. General surgery was consulted to assist with management.  Assessment/Plan: Biliary Pancreatitis -RUQ US--cholelithiasis without signs of cholecystitis -empiric antibiotics--continue imipenem -consult general surgery -lipase 1269 -npo for now -continue IVF -pt states "I don't know why the morphine is not working" -increase morphine to 4 mg q 3 hours prn pain  Transaminasemia -trending down -check hep B and C -check HIV  Anxiety/Depression -continue lexapro, trazodone  Polysubstance abuse history (opiates, benzo, cocaine, Etoh) -See note from psychiatry--Dr. Margo AyeHall on 02/03/15 on Care Everywhere -"I have legitimate pain, and I don't want to be labeled as narcotic seeking" -pt became annoyed when I addressed her previous history of substance abuse--"where did you get that information?" -pain appears to be out of proportion with exam and objective findings -I assured pt that we will treat her pain appropriately  Tobacco abuse -cessation discussed    Disposition Plan:   Home in 2-3 days  Family Communication:  No Family at bedside--Total time spent 35 minutes.  Greater than 50% spent face to face counseling and coordinating care. [phone]   Consultants:  General surgery  Code Status:  FULL  DVT Prophylaxis:  SCDs   Procedures: As Listed in  Progress Note Above  Antibiotics: None    Subjective: patient states that the morphine is not helping her pain. She denies any vomiting but complains of nausea. Denies any fevers, chills, chest pain/breath, headache, neck pain. Denies any dysuria, hematuria.  Objective: Vitals:   10/17/15 0200 10/17/15 0307 10/17/15 0515 10/17/15 0923  BP: 138/81 129/74 122/72 122/63  Pulse: 68 65 67 62  Resp:  20 20 20   Temp:  98.5 F (36.9 C) 98.3 F (36.8 C) 98.4 F (36.9 C)  TempSrc:  Oral Oral Oral  SpO2: 100% 100% 100% 100%  Weight:  61.8 kg (136 lb 3.9 oz)    Height:  5\' 2"  (1.575 m)      Intake/Output Summary (Last 24 hours) at 10/17/15 1049 Last data filed at 10/17/15 0429  Gross per 24 hour  Intake                0 ml  Output                0 ml  Net                0 ml   Weight change:  Exam:   General:  Pt is alert, follows commands appropriately, not in acute distress  HEENT: No icterus, No thrush, No neck mass, [place]rel/AT  Cardiovascular: RRR, S1/S2, no rubs, no gallops  Respiratory: CTA bilaterally, no wheezing, no crackles, no rhonchi  Abdomen: Soft/+BS, RUQ and epigastric pain, non distended, no guarding  Extremities: No edema, No lymphangitis, No petechiae, No rashes, no synovitis   Data Reviewed: I have personally reviewed following labs and imaging studies Basic Metabolic Panel:  Recent Labs Lab 10/16/15 2001 10/17/15 0655  NA 132* 136  K  3.4* 3.3*  CL 100* 104  CO2 26 25  GLUCOSE 106* 112*  BUN 9 9  CREATININE 0.71 0.62  CALCIUM 9.1 8.8*   Liver Function Tests:  Recent Labs Lab 10/16/15 2001 10/17/15 0655  AST 224* 102*  ALT 320* 203*  ALKPHOS 130* 106  BILITOT 1.1 0.7  PROT 7.3 5.9*  ALBUMIN 4.3 3.5    Recent Labs Lab 10/16/15 2001  LIPASE 1,269*   No results for input(s): AMMONIA in the last 168 hours. Coagulation Profile: No results for input(s): INR, PROTIME in the last 168 hours. CBC:  Recent Labs Lab 10/16/15 2001  10/17/15 0655  WBC 11.1* 8.0  HGB 13.4 11.3*  HCT 40.7 35.2*  MCV 98.1 98.1  PLT 213 155   Cardiac Enzymes: No results for input(s): CKTOTAL, CKMB, CKMBINDEX, TROPONINI in the last 168 hours. BNP: Invalid input(s): POCBNP CBG: No results for input(s): GLUCAP in the last 168 hours. HbA1C: No results for input(s): HGBA1C in the last 72 hours. Urine analysis:    Component Value Date/Time   COLORURINE YELLOW 10/16/2015 2121   APPEARANCEUR CLOUDY (A) 10/16/2015 2121   LABSPEC 1.016 10/16/2015 2121   PHURINE 7.0 10/16/2015 2121   GLUCOSEU NEGATIVE 10/16/2015 2121   HGBUR NEGATIVE 10/16/2015 2121   BILIRUBINUR NEGATIVE 10/16/2015 2121   KETONESUR 15 (A) 10/16/2015 2121   PROTEINUR NEGATIVE 10/16/2015 2121   UROBILINOGEN 2.0 (H) 10/16/2015 1853   NITRITE NEGATIVE 10/16/2015 2121   LEUKOCYTESUR NEGATIVE 10/16/2015 2121   Sepsis Labs: @LABRCNTIP (procalcitonin:4,lacticidven:4) )No results found for this or any previous visit (from the past 240 hour(s)).   Scheduled Meds: . escitalopram  20 mg Oral Daily  . gabapentin  300 mg Oral TID  . imipenem-cilastatin  500 mg Intravenous Q6H  . nicotine  21 mg Transdermal Once  . traZODone  200 mg Oral QHS   Continuous Infusions: . sodium chloride 1,000 mL (10/17/15 0429)    Procedures/Studies: [place] Abdomen Limited Ruq  Result Date: 10/17/2015 CLINICAL DATA:  Right upper quadrant abdominal pain EXAM: US ABDOMEN LIMITED - RIGHT UPPER QUADRANT COMPARISON:  None. FINDINGS: Gallbladder: Multiple shadowing gallstones. Gallbladder reportedly tender but there is partial distention and no wall thickening or pericholecystic edema Common bile duct: Diameter: 3 mm.  Where visualized, no filling defect. Liver: No focal lesion identified. Within normal limits in parenchymal echogenicity. IMPRESSION: Cholelithiasis. The gallbladder is tender but there is no distension or wall inflammation to suggest acute cholecystitis. Electronically Signed   By:  Marnee SpringJonathon  Watts M.D.   On: 10/17/2015 01:30    Lachelle Rissler, DO  [facility] Pager [phone]  If 7PM-7AM, please contact night-coverage [email or web address] Password [email or web address] 10/17/2015, 10:49 AM   LOS: 0 days

## 2015-10-17 NOTE — Progress Notes (Signed)
RN reviewed POC with patient. Per Dr. Arbutus Leasat, pt stills need to be NPO until surgery consult. Pt is now upset that she is not eating and verbalized that morphine is not helping with her pain and unsure with MD is ordering this medication. PT now wants to leave AMA and refusing morphine and antibiotic. RN offer to call MD to speak with pt but pt verbalized that that she is unsure what to do at this time. Pt stated "i was better before I came here". Pt then asked RN to make decision for her and RN verbalizes that this is up to pt to make these decisions. Pt then direct her angers at RN and verbalized that RN is not attentive to pt. RN asked pt once more r/t her pain med and her abx. Pt then refused and continue to request to leave AMA. RN explained to pt risk vs benefits of leaving AMA. Pt is now wanting time to think.  RN exit room and Community education officernotified Charge RN of situation.MD paged.   Sim BoastHavy, RN

## 2015-10-17 NOTE — ED Provider Notes (Signed)
[facility]-EMERGENCY DEPT Provider Note   CSN: [id number] Arrival date & time: 10/16/15  1947  By signing my name below, I, Anita Harrington, attest that this documentation has been prepared under the direction and in the presence of Anita Batonourtney F Natacha Jepsen, MD. Electronically Signed: Angelene GiovanniEmmanuella Harrington, ED Scribe. 10/17/15. 12:44 AM.   History   Chief Complaint Chief Complaint  Patient presents with  . Abdominal Pain    HPI Comments: Anita BeckStacey Harrington is a 40 y.o. female who presents to the Emergency Department complaining of gradually worsening 8/10 upper to mid abdominal pain onset yesterday. She reports associated upper back pain and nausea. She states that the pain is worse with deep breathing. No alleviating factors noted. She states that she tried Tylenol with no relief. She denies any ETOH use in several months. She denies a hx of cholecystectomy. [facility] and advised to come to the ED. She reports that she is a current one pack a day cigarette smoker. She denies any fever, chills, vomiting, diarrhea, or any urinary symptoms.    The history is provided by the patient. No language interpreter was used.    Past Medical History:  Diagnosis Date  . Anxiety     There are no active problems to display for this patient.   Past Surgical History:  Procedure Laterality Date  . GASTRIC BYPASS    . OVARIAN CYST REMOVAL      OB History    No data available       Home Medications    Prior to Admission medications   Medication Sig Start Date End Date Taking? Authorizing Provider  escitalopram (LEXAPRO) 20 MG tablet Take 20 mg by mouth daily.   Yes Historical Provider, MD  gabapentin (NEURONTIN) 300 MG capsule Take 300 mg by mouth 3 (three) times daily.   Yes Historical Provider, MD  Multiple Vitamin (MULTIVITAMIN WITH MINERALS) TABS tablet Take 1 tablet by mouth daily.   Yes Historical Provider, MD  progesterone (PROMETRIUM) 100 MG capsule Take 100 mg by mouth daily.    Yes Historical Provider, MD  traZODone (DESYREL) 100 MG tablet Take 200 mg by mouth at bedtime.   Yes Historical Provider, MD    Family History History reviewed. No pertinent family history.  Social History Social History  Substance Use Topics  . Smoking status: Current Every Day Smoker    Packs/day: 1.00    Years: 25.00  . Smokeless tobacco: Never Used  . Alcohol use No     Allergies   Levaquin [levofloxacin in d5w] and Penicillins   Review of Systems Review of Systems  Constitutional: Negative for chills and fever.  Gastrointestinal: Positive for abdominal pain and nausea. Negative for diarrhea.  Genitourinary: Negative for dysuria and frequency.  Musculoskeletal: Positive for back pain.  All other systems reviewed and are negative.    Physical Exam Updated Vital Signs BP 126/80   Pulse 86   Temp 98.2 F (36.8 C) (Oral)   Resp 17   LMP 10/09/2015 (Exact Date)   SpO2 99%   Physical Exam  Constitutional: She is oriented to person, place, and time. She appears well-developed and well-nourished. No distress.  HENT:  Head: Normocephalic and atraumatic.  Cardiovascular: Normal rate, regular rhythm and normal heart sounds.   Pulmonary/Chest: Effort normal and breath sounds normal. No respiratory distress. She has no wheezes.  Abdominal: Soft. Bowel sounds are normal. She exhibits no mass. There is tenderness. There is no guarding.  Epigastric and right upper  quadrant tenderness to palpation without rebound or guarding  Neurological: She is alert and oriented to person, place, and time.  Skin: Skin is warm and dry.  Psychiatric: She has a normal mood and affect.  Nursing note and vitals reviewed.    ED Treatments / Results  DIAGNOSTIC STUDIES: Oxygen Saturation is 99% on RA, normal by my interpretation.    COORDINATION OF CARE: 12:35 AM- Pt advised of plan for treatment and pt agrees. Pt informed of her lab results. She will be receive US abdomen and then  admitted for further evaluation.    Labs (all labs ordered are listed, but only abnormal results are displayed) Labs Reviewed  LIPASE, BLOOD - Abnormal; Notable for the following:       Result Value   Lipase 1,269 (*)    All other components within normal limits  COMPREHENSIVE METABOLIC PANEL - Abnormal; Notable for the following:    Sodium 132 (*)    Potassium 3.4 (*)    Chloride 100 (*)    Glucose, Bld 106 (*)    AST 224 (*)    ALT 320 (*)    Alkaline Phosphatase 130 (*)    All other components within normal limits  CBC - Abnormal; Notable for the following:    WBC 11.1 (*)    All other components within normal limits  URINALYSIS, ROUTINE W REFLEX MICROSCOPIC (NOT AT [facility]) - Abnormal; Notable for the following:    APPearance CLOUDY (*)    Ketones, ur 15 (*)    All other components within normal limits  I-STAT BETA HCG BLOOD, ED ([facility], [facility], [facility] ONLY)    EKG  EKG Interpretation None       Radiology US Abdomen Limited Ruq  Result Date: 10/17/2015 CLINICAL DATA:  Right upper quadrant abdominal pain EXAM: US ABDOMEN LIMITED - RIGHT UPPER QUADRANT COMPARISON:  None. FINDINGS: Gallbladder: Multiple shadowing gallstones. Gallbladder reportedly tender but there is partial distention and no wall thickening or pericholecystic edema Common bile duct: Diameter: 3 mm.  Where visualized, no filling defect. Liver: No focal lesion identified. Within normal limits in parenchymal echogenicity. IMPRESSION: Cholelithiasis. The gallbladder is tender but there is no distension or wall inflammation to suggest acute cholecystitis. Electronically Signed   By: Marnee Spring M.D.   On: 10/17/2015 01:30    Procedures Procedures (including critical care time)  Medications Ordered in ED Medications  nicotine (NICODERM CQ - dosed in mg/24 hours) patch 21 mg (not administered)  sodium chloride 0.9 % bolus 1,000 mL (1,000 mLs Intravenous New Bag/Given 10/17/15 0117)  ondansetron (ZOFRAN) injection 4 mg  (4 mg Intravenous Given 10/17/15 0117)  morphine 4 MG/ML injection 4 mg (4 mg Intravenous Given 10/17/15 0117)     Initial Impression / Assessment and Plan / ED Course  Anita Baton, MD has reviewed the triage vital signs and the nursing notes.  Pertinent labs & imaging results that were available during my care of the patient were reviewed by me and considered in my medical decision making (see chart for details).  Clinical Course   Patient presents with epigastric and right upper quadrant pain. Nontoxic. Afebrile. Lipase noted to be greater than 1200. LFTs elevated. Denies recent alcohol use. Patient was given fluid. She given pain and nausea medication. Right upper quadrant ultrasound shows evidence of gallstones without evidence of choledocholithiasis or cholecystitis. No emergent indication for surgery or GI evaluation at this time. Will admit for IV hydration and pain control to the hospitalist service.  Final Clinical Impressions(s) / ED Diagnoses   Final diagnoses:  Abdominal pain  Other acute pancreatitis  Gallstones    New Prescriptions New Prescriptions   No medications on file   I personally performed the services described in this documentation, which was scribed in my presence. The recorded information has been reviewed and is accurate.    Anita Batonourtney F Jenelle Drennon, MD 10/17/15 [phone]

## 2015-10-18 DIAGNOSIS — Z720 Tobacco use: Secondary | ICD-10-CM

## 2015-10-18 DIAGNOSIS — K851 Biliary acute pancreatitis without necrosis or infection: Principal | ICD-10-CM

## 2015-10-18 LAB — COMPREHENSIVE METABOLIC PANEL
ALK PHOS: 103 U/L (ref 38–126)
ALT: 127 U/L — AB (ref 14–54)
AST: 37 U/L (ref 15–41)
Albumin: 3.3 g/dL — ABNORMAL LOW (ref 3.5–5.0)
Anion gap: 10 (ref 5–15)
BUN: 7 mg/dL (ref 6–20)
CALCIUM: 9 mg/dL (ref 8.9–10.3)
CHLORIDE: 107 mmol/L (ref 101–111)
CO2: 21 mmol/L — AB (ref 22–32)
CREATININE: 0.66 mg/dL (ref 0.44–1.00)
Glucose, Bld: 72 mg/dL (ref 65–99)
Potassium: 3.6 mmol/L (ref 3.5–5.1)
SODIUM: 138 mmol/L (ref 135–145)
Total Bilirubin: 0.9 mg/dL (ref 0.3–1.2)
Total Protein: 6 g/dL — ABNORMAL LOW (ref 6.5–8.1)

## 2015-10-18 LAB — HIV ANTIBODY (ROUTINE TESTING W REFLEX): HIV Screen 4th Generation wRfx: NONREACTIVE

## 2015-10-18 MED ORDER — FENTANYL CITRATE (PF) 100 MCG/2ML IJ SOLN
50.0000 ug | INTRAMUSCULAR | Status: DC | PRN
  Administered 2015-10-19 – 2015-10-20 (×7): 50 ug via INTRAVENOUS
  Filled 2015-10-18 (×7): qty 2

## 2015-10-18 MED ORDER — NICOTINE 21 MG/24HR TD PT24
21.0000 mg | MEDICATED_PATCH | Freq: Every day | TRANSDERMAL | Status: DC
  Administered 2015-10-18 – 2015-10-20 (×3): 21 mg via TRANSDERMAL
  Filled 2015-10-18 (×3): qty 1

## 2015-10-18 MED ORDER — SODIUM CHLORIDE 0.9 % IV SOLN
INTRAVENOUS | Status: AC
Start: 2015-10-18 — End: 2015-10-19
  Administered 2015-10-18: 09:00:00 via INTRAVENOUS

## 2015-10-18 NOTE — Progress Notes (Signed)
Patient requesting tylenol for 5/10 headache, frontal, ongoing. Patient states she is having 5/10 pain above umbilicus that radiates to RUQ. Patient [facility] being at [facility]. 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 MD paged. RN will continue to monitor patient.

## 2015-10-18 NOTE — Progress Notes (Signed)
  Progress Note: General Surgery Service   Subjective: Continued abdominal and back pain, little appetite  Objective: Vital signs in last 24 hours: Temp:  [97.8 F (36.6 C)-98.7 F (37.1 C)] 98.4 F (36.9 C) (08/23 0539) Pulse Rate:  [57-74] 74 (08/23 0539) Resp:  [18-22] 18 (08/23 0539) BP: (121-136)/(62-82) 129/63 (08/23 0539) SpO2:  [97 %-100 %] 97 % (08/23 0539) Last BM Date: 10/16/15  Intake/Output from previous day: 08/22 0701 - 08/23 0700 In: 2100 [I.V.:1700; IV Piggyback:400] Out: -  Intake/Output this shift: No intake/output data recorded.  Lungs: CTAB  Cardiovascular: RRR  Abd: soft, mod tenderness to epigastric palpation  Extremities: no edema  Neuro: AOx4  Lab Results: CBC   Recent Labs  10/16/15 2001 10/17/15 0655  WBC 11.1* 8.0  HGB 13.4 11.3*  HCT 40.7 35.2*  PLT 213 155   BMET  Recent Labs  10/17/15 0655 10/18/15 0537  NA 136 138  K 3.3* 3.6  CL 104 107  CO2 25 21*  GLUCOSE 112* 72  BUN 9 7  CREATININE 0.62 0.66  CALCIUM 8.8* 9.0   PT/INR No results for input(s): LABPROT, INR in the last 72 hours. ABG No results for input(s): PHART, HCO3 in the last 72 hours.  Invalid input(s): PCO2, PO2  Studies/Results:  Anti-infectives: Anti-infectives    Start     Dose/Rate Route Frequency Ordered Stop   10/17/15 0900  imipenem-cilastatin (PRIMAXIN) 500 mg in sodium chloride 0.9 % 100 mL IVPB  Status:  Discontinued     500 mg 200 mL/hr over 30 Minutes Intravenous Every 6 hours 10/17/15 0827 10/17/15 0830   10/17/15 0900  imipenem-cilastatin (PRIMAXIN) 500 mg in sodium chloride 0.9 % 100 mL IVPB     500 mg 200 mL/hr over 30 Minutes Intravenous Every 6 hours 10/17/15 0830        Medications: Scheduled Meds: . escitalopram  20 mg Oral Daily  . gabapentin  300 mg Oral TID  . imipenem-cilastatin  500 mg Intravenous Q6H  . nicotine  21 mg Transdermal Daily  . traZODone  200 mg Oral QHS   Continuous Infusions: . sodium chloride      PRN Meds:.acetaminophen **OR** acetaminophen, morphine injection, ondansetron **OR** ondansetron (ZOFRAN) IV  Assessment/Plan: Patient Active Problem List   Diagnosis Date Noted  . Gallstone pancreatitis 10/17/2015  . Transaminasemia 10/17/2015  . Depression 10/17/2015  . Tobacco abuse 10/17/2015  . Pancreatitis 10/17/2015  . Other acute pancreatitis    Gallstone pancreatitis -patient continues to have abdominal pain, slightly improved, LFTs improving -discussed options for surgery, patient is deciding if she would like to delay surgery, we discussed risks of delaying and leading to recurrence or other gallstone associated problems. Will hold off on surgery today, while patient decided -patient does not understand why she is in the hospital, we discussed need to allow the pancreas to calm with bowel rest, that we assess her clinically multiple times a day, and also discussed that when she is discharged her home does not allow narcotics and she is maxing out current IV written medications -if wanted hard source of pancreatitis inflammation could get CRP or procalcitonin, otherwise given pain she likely has some active pancreatitis and would continue bowel rest   LOS: 1 day   Rodman PickleLuke Aaron Aking Klabunde, MD Pg# [phone] [facility]

## 2015-10-18 NOTE — Progress Notes (Signed)
PROGRESS NOTE  Elby BeckStacey Milowsky ZOX:[id number] DOB: [birth year] DOA: 10/16/2015 PCP: No PCP Per Patient  Brief History:  40 year old female with a history of gastric bypass, anxiety, depression, polysubstance abuse previously on suboxone 10 years ago presented with one-day history of abdominal pain in epigastric and right upper quadrant area. She has some nausea without emesis. She denied any fevers, chills, chest pain, shortness breath, diarrhea. Right upper quadrant ultrasound on 10/16/2015 showed cholelithiasis without biliary ductal dilatation. The patient was started on intravenous antibiotics and intravenous fluids. General surgery was consulted to assist with management.  Assessment/Plan: Biliary Pancreatitis -RUQ US--cholelithiasis without signs of cholecystitis -empiric antibiotics--continue imipenem -lipase 1269 >750 -npo for now -continue IVF -pt states "I don't know why the morphine is not working" -increase morphine to 4 mg q 3 hours prn pain. - Gen. surgery consultation and follow-up appreciated. They have discussed options for surgery and patient is contemplating. - Patient continues to complain of abdominal pain 6/10 although does not appear in pain and no objective evidence of pain i.e. distress, tachycardia, hypertension etc. No change in pain regimen at this time.  Transaminasemia -trending down -check hep B and C-pending -check HIV: Non-reactive  Anxiety/Depression -continue lexapro, trazodone  Polysubstance abuse history (opiates, benzo, cocaine, Etoh) -See note from psychiatry--Dr. Margo AyeHall on 02/03/15 on Care Everywhere -"I have legitimate pain, and I don't want to be labeled as narcotic seeking" -pt became annoyed when I addressed her previous history of substance abuse--"where did you get that information?" -pain appears to be out of proportion with exam and objective findings. Please see discussion about pain management above. -I assured pt that we will  treat her pain appropriately  Tobacco abuse -cessation discussed  Hypokalemia - Replaced  Anemia - Follow CBC.    Disposition Plan:   Home in 2-3 days  Family Communication:  No Family at bedside--Total time spent 35 minutes.  Greater than 50% spent face to face counseling and coordinating care. [phone]   Consultants:  General surgery  Code Status:  FULL  DVT Prophylaxis:  SCDs   Procedures: As Listed in Progress Note Above  Antibiotics: None    Subjective: Patient continues to complain of 6/10 pain and states that the morphine is not working for her but does not have objective evidence of pain and actually is asking for something to eat. Same was confirmed with patient's nursing.  Objective: Vitals:   10/18/15 0930 10/18/15 1259 10/18/15 1330 10/18/15 1703  BP: (!) 102/58 (!) 107/50 115/69 120/65  Pulse: 62 63 79 75  Resp:  16 12 16   Temp:  98.3 F (36.8 C) 98.2 F (36.8 C) 97.9 F (36.6 C)  TempSrc:  Oral Oral Oral  SpO2:  98% 99% 98%  Weight:      Height:        Intake/Output Summary (Last 24 hours) at 10/18/15 1957 Last data filed at 10/18/15 0309  Gross per 24 hour  Intake           811.25 ml  Output                0 ml  Net           811.25 ml   Weight change:  Exam:   General:  Pt is alert, follows commands appropriately, not in acute distress  HEENT: No icterus, No thrush, No neck mass, [place]/AT  Cardiovascular: RRR, S1/S2, no rubs, no gallops  Respiratory: CTA bilaterally, no  wheezing, no crackles, no rhonchi  Abdomen: Soft/+BS, RUQ and epigastric pain, non distended, no guarding. No peritoneal signs.  Extremities: No edema, No lymphangitis, No petechiae, No rashes, no synovitis   Data Reviewed: I have personally reviewed following labs and imaging studies Basic Metabolic Panel:  Recent Labs Lab 10/16/15 2001 10/17/15 0655 10/18/15 0537  NA 132* 136 138  K 3.4* 3.3* 3.6  CL 100* 104 107  CO2 26 25 21*  GLUCOSE 106* 112*  72  BUN 9 9 7   CREATININE 0.71 0.62 0.66  CALCIUM 9.1 8.8* 9.0   Liver Function Tests:  Recent Labs Lab 10/16/15 2001 10/17/15 0655 10/18/15 0537  AST 224* 102* 37  ALT 320* 203* 127*  ALKPHOS 130* 106 103  BILITOT 1.1 0.7 0.9  PROT 7.3 5.9* 6.0*  ALBUMIN 4.3 3.5 3.3*    Recent Labs Lab 10/16/15 2001 10/17/15 0655  LIPASE 1,269* 750*   No results for input(s): AMMONIA in the last 168 hours. Coagulation Profile: No results for input(s): INR, PROTIME in the last 168 hours. CBC:  Recent Labs Lab 10/16/15 2001 10/17/15 0655  WBC 11.1* 8.0  HGB 13.4 11.3*  HCT 40.7 35.2*  MCV 98.1 98.1  PLT 213 155   Cardiac Enzymes: No results for input(s): CKTOTAL, CKMB, CKMBINDEX, TROPONINI in the last 168 hours. BNP: Invalid input(s): POCBNP CBG: No results for input(s): GLUCAP in the last 168 hours. HbA1C: No results for input(s): HGBA1C in the last 72 hours. Urine analysis:    Component Value Date/Time   COLORURINE YELLOW 10/16/2015 2121   APPEARANCEUR CLOUDY (A) 10/16/2015 2121   LABSPEC 1.016 10/16/2015 2121   PHURINE 7.0 10/16/2015 2121   GLUCOSEU NEGATIVE 10/16/2015 2121   HGBUR NEGATIVE 10/16/2015 2121   BILIRUBINUR NEGATIVE 10/16/2015 2121   KETONESUR 15 (A) 10/16/2015 2121   PROTEINUR NEGATIVE 10/16/2015 2121   UROBILINOGEN 2.0 (H) 10/16/2015 1853   NITRITE NEGATIVE 10/16/2015 2121   LEUKOCYTESUR NEGATIVE 10/16/2015 2121   Sepsis Labs: @LABRCNTIP (procalcitonin:4,lacticidven:4) )No results found for this or any previous visit (from the past 240 hour(s)).   Scheduled Meds: . escitalopram  20 mg Oral Daily  . gabapentin  300 mg Oral TID  . imipenem-cilastatin  500 mg Intravenous Q6H  . nicotine  21 mg Transdermal Daily  . traZODone  200 mg Oral QHS   Continuous Infusions: . sodium chloride 75 mL/hr at 10/18/15 9604    Procedures/Studies: Mr Abdomen Mrcp Wo Cm  Result Date: 10/17/2015 CLINICAL DATA:  Gallstones and biliary dilatation. EXAM: MRI  ABDOMEN WITHOUT CONTRAST  (INCLUDING MRCP) TECHNIQUE: Multiplanar multisequence MR imaging of the abdomen was performed. Heavily T2-weighted images of the biliary and pancreatic ducts were obtained, and three-dimensional MRCP images were rendered by post processing. COMPARISON:  10/17/2015 FINDINGS: Lower chest:  No acute findings. Hepatobiliary: No mass visualized on this unenhanced exam. Small stones within the gallbladder measuring up to 9 mm. No gallbladder wall thickening. The common bile duct measures up to 5 mm, image 27 of series 4. Mild intrahepatic biliary prominence. No choledocholithiasis identified. Pancreas: No mass or inflammatory process visualized on this unenhanced exam. Spleen:  Within normal limits in size. Adrenals/Urinary Tract: No adrenal mass identified. No evidence of renal mass or hydronephrosis. Stomach/Bowel:  Unremarkable. Vascular/Lymphatic: No pathologically enlarged lymph nodes identified. No evidence of abdominal aortic aneurysm. Other:  None. Musculoskeletal:  No suspicious bone lesions identified. IMPRESSION: 1. Gallstones. 2. No significant bile duct dilatation and no evidence for choledocholithiasis. Electronically Signed   By: Ladona Ridgel  Bradly ChrisStroud M.D.   On: 10/17/2015 12:54   [place] Abdomen Limited Ruq  Result Date: 10/17/2015 CLINICAL DATA:  Right upper quadrant abdominal pain EXAM: US ABDOMEN LIMITED - RIGHT UPPER QUADRANT COMPARISON:  None. FINDINGS: Gallbladder: Multiple shadowing gallstones. Gallbladder reportedly tender but there is partial distention and no wall thickening or pericholecystic edema Common bile duct: Diameter: 3 mm.  Where visualized, no filling defect. Liver: No focal lesion identified. Within normal limits in parenchymal echogenicity. IMPRESSION: Cholelithiasis. The gallbladder is tender but there is no distension or wall inflammation to suggest acute cholecystitis. Electronically Signed   By: Marnee SpringJonathon  Watts M.D.   On: 10/17/2015 01:30    Elick Aguilera,  MD, FACP, FHM. [facility] Pager [phone]  If 7PM-7AM, please contact night-coverage [email or web address] Password [email or web address] 10/18/2015, 8:03 PM   LOS: 1 day

## 2015-10-19 ENCOUNTER — Inpatient Hospital Stay (HOSPITAL_COMMUNITY): Payer: BLUE CROSS/BLUE SHIELD | Admitting: Critical Care Medicine

## 2015-10-19 ENCOUNTER — Inpatient Hospital Stay (HOSPITAL_COMMUNITY): Payer: BLUE CROSS/BLUE SHIELD

## 2015-10-19 ENCOUNTER — Encounter (HOSPITAL_COMMUNITY)
Admission: EM | Disposition: A | Discharge status: Home / Self Care | Payer: Self-pay | Source: Home / Self Care | Attending: Internal Medicine

## 2015-10-19 ENCOUNTER — Encounter (HOSPITAL_COMMUNITY): Payer: Self-pay | Admitting: Critical Care Medicine

## 2015-10-19 DIAGNOSIS — R740 Nonspecific elevation of levels of transaminase and lactic acid dehydrogenase [LDH]: Secondary | ICD-10-CM

## 2015-10-19 HISTORY — PX: CHOLECYSTECTOMY: SHX55

## 2015-10-19 LAB — COMPREHENSIVE METABOLIC PANEL
ALBUMIN: 3.1 g/dL — AB (ref 3.5–5.0)
ALT: 89 U/L — ABNORMAL HIGH (ref 14–54)
ANION GAP: 5 (ref 5–15)
AST: 22 U/L (ref 15–41)
Alkaline Phosphatase: 97 U/L (ref 38–126)
BUN: 8 mg/dL (ref 6–20)
CHLORIDE: 107 mmol/L (ref 101–111)
CO2: 26 mmol/L (ref 22–32)
Calcium: 9 mg/dL (ref 8.9–10.3)
Creatinine, Ser: 0.72 mg/dL (ref 0.44–1.00)
GFR calc Af Amer: >60 mL/min (ref >60)
GFR calc non Af Amer: >60 mL/min (ref >60)
GLUCOSE: 139 mg/dL — AB (ref 65–99)
POTASSIUM: 3.8 mmol/L (ref 3.5–5.1)
SODIUM: 138 mmol/L (ref 135–145)
Total Bilirubin: 0.3 mg/dL (ref 0.3–1.2)
Total Protein: 6 g/dL — ABNORMAL LOW (ref 6.5–8.1)

## 2015-10-19 LAB — HEPATITIS B SURFACE ANTIGEN: HEP B S AG: NEGATIVE

## 2015-10-19 LAB — LIPASE, BLOOD: Lipase: 154 U/L — ABNORMAL HIGH (ref 11–51)

## 2015-10-19 LAB — CBC
HEMATOCRIT: 36.4 % (ref 36.0–46.0)
HEMOGLOBIN: 11.8 g/dL — AB (ref 12.0–15.0)
MCH: 31.7 pg (ref 26.0–34.0)
MCHC: 32.4 g/dL (ref 30.0–36.0)
MCV: 97.8 fL (ref 78.0–100.0)
Platelets: 170 10*3/uL (ref 150–400)
RBC: 3.72 MIL/uL — ABNORMAL LOW (ref 3.87–5.11)
RDW: 15.2 % (ref 11.5–15.5)
WBC: 8.4 10*3/uL (ref 4.0–10.5)

## 2015-10-19 LAB — HEPATITIS C ANTIBODY

## 2015-10-19 SURGERY — LAPAROSCOPIC CHOLECYSTECTOMY
Anesthesia: General | Site: Abdomen

## 2015-10-19 MED ORDER — FENTANYL CITRATE (PF) 100 MCG/2ML IJ SOLN
INTRAMUSCULAR | Status: AC
  Filled 2015-10-19: qty 2

## 2015-10-19 MED ORDER — GLYCOPYRROLATE 0.2 MG/ML IJ SOLN
INTRAMUSCULAR | Status: DC | PRN
  Administered 2015-10-19: 0.6 mg via INTRAVENOUS

## 2015-10-19 MED ORDER — OXYCODONE HCL 5 MG PO TABS
5.0000 mg | ORAL_TABLET | Freq: Once | ORAL | Status: AC
  Administered 2015-10-19: 5 mg via ORAL

## 2015-10-19 MED ORDER — SUCCINYLCHOLINE CHLORIDE 200 MG/10ML IV SOSY
PREFILLED_SYRINGE | INTRAVENOUS | Status: AC
  Filled 2015-10-19: qty 10

## 2015-10-19 MED ORDER — BUPIVACAINE-EPINEPHRINE 0.25% -1:200000 IJ SOLN
INTRAMUSCULAR | Status: DC | PRN
  Administered 2015-10-19: 8 mL

## 2015-10-19 MED ORDER — LIDOCAINE 2% (20 MG/ML) 5 ML SYRINGE
INTRAMUSCULAR | Status: DC | PRN
  Administered 2015-10-19: 100 mg via INTRAVENOUS

## 2015-10-19 MED ORDER — KETOROLAC TROMETHAMINE 30 MG/ML IJ SOLN
INTRAMUSCULAR | Status: AC
Start: 2015-10-19 — End: 2015-10-19
  Filled 2015-10-19: qty 1

## 2015-10-19 MED ORDER — ROCURONIUM BROMIDE 10 MG/ML (PF) SYRINGE
PREFILLED_SYRINGE | INTRAVENOUS | Status: DC | PRN
  Administered 2015-10-19: 30 mg via INTRAVENOUS

## 2015-10-19 MED ORDER — PROPOFOL 10 MG/ML IV BOLUS
INTRAVENOUS | Status: AC
  Filled 2015-10-19: qty 20

## 2015-10-19 MED ORDER — HYDROCODONE-ACETAMINOPHEN 5-325 MG PO TABS
1.0000 | ORAL_TABLET | Freq: Four times a day (QID) | ORAL | Status: DC | PRN
  Administered 2015-10-19 – 2015-10-20 (×2): 2 via ORAL
  Filled 2015-10-19 (×3): qty 2

## 2015-10-19 MED ORDER — ACETAMINOPHEN 10 MG/ML IV SOLN
INTRAVENOUS | Status: AC
  Filled 2015-10-19: qty 100

## 2015-10-19 MED ORDER — LACTATED RINGERS IV SOLN
INTRAVENOUS | Status: DC
  Administered 2015-10-19: 15:00:00 via INTRAVENOUS

## 2015-10-19 MED ORDER — ROCURONIUM BROMIDE 10 MG/ML (PF) SYRINGE
PREFILLED_SYRINGE | INTRAVENOUS | Status: AC
  Filled 2015-10-19: qty 10

## 2015-10-19 MED ORDER — BUPIVACAINE-EPINEPHRINE (PF) 0.25% -1:200000 IJ SOLN
INTRAMUSCULAR | Status: AC
  Filled 2015-10-19: qty 30

## 2015-10-19 MED ORDER — LIDOCAINE 2% (20 MG/ML) 5 ML SYRINGE
INTRAMUSCULAR | Status: AC
  Filled 2015-10-19: qty 5

## 2015-10-19 MED ORDER — MIDAZOLAM HCL 5 MG/5ML IJ SOLN
INTRAMUSCULAR | Status: DC | PRN
  Administered 2015-10-19: 2 mg via INTRAVENOUS

## 2015-10-19 MED ORDER — ACETAMINOPHEN 10 MG/ML IV SOLN
1000.0000 mg | Freq: Once | INTRAVENOUS | Status: AC
  Administered 2015-10-19: 1000 mg via INTRAVENOUS

## 2015-10-19 MED ORDER — MIDAZOLAM HCL 2 MG/2ML IJ SOLN
INTRAMUSCULAR | Status: AC
  Filled 2015-10-19: qty 2

## 2015-10-19 MED ORDER — IOPAMIDOL (ISOVUE-300) INJECTION 61%
INTRAVENOUS | Status: AC
  Filled 2015-10-19: qty 50

## 2015-10-19 MED ORDER — DEXAMETHASONE SODIUM PHOSPHATE 10 MG/ML IJ SOLN
INTRAMUSCULAR | Status: DC | PRN
  Administered 2015-10-19: 10 mg via INTRAVENOUS

## 2015-10-19 MED ORDER — FENTANYL CITRATE (PF) 100 MCG/2ML IJ SOLN
25.0000 ug | INTRAMUSCULAR | Status: DC | PRN
  Administered 2015-10-19 (×3): 50 ug via INTRAVENOUS

## 2015-10-19 MED ORDER — SUCCINYLCHOLINE CHLORIDE 200 MG/10ML IV SOSY
PREFILLED_SYRINGE | INTRAVENOUS | Status: DC | PRN
  Administered 2015-10-19: 100 mg via INTRAVENOUS

## 2015-10-19 MED ORDER — ONDANSETRON HCL 4 MG/2ML IJ SOLN
INTRAMUSCULAR | Status: DC | PRN
  Administered 2015-10-19: 4 mg via INTRAVENOUS

## 2015-10-19 MED ORDER — FENTANYL CITRATE (PF) 100 MCG/2ML IJ SOLN
INTRAMUSCULAR | Status: AC
Start: 2015-10-19 — End: 2015-10-20
  Filled 2015-10-19: qty 2

## 2015-10-19 MED ORDER — SUGAMMADEX SODIUM 200 MG/2ML IV SOLN
INTRAVENOUS | Status: AC
  Filled 2015-10-19: qty 2

## 2015-10-19 MED ORDER — OXYCODONE HCL 5 MG PO TABS
ORAL_TABLET | ORAL | Status: AC
  Filled 2015-10-19: qty 1

## 2015-10-19 MED ORDER — PROMETHAZINE HCL 25 MG/ML IJ SOLN
6.2500 mg | INTRAMUSCULAR | Status: DC | PRN
Start: 2015-10-19 — End: 2015-10-19

## 2015-10-19 MED ORDER — HYDROMORPHONE HCL 1 MG/ML IJ SOLN
0.5000 mg | Freq: Once | INTRAMUSCULAR | Status: AC
  Administered 2015-10-19: 0.5 mg via INTRAVENOUS

## 2015-10-19 MED ORDER — 0.9 % SODIUM CHLORIDE (POUR BTL) OPTIME
TOPICAL | Status: DC | PRN
  Administered 2015-10-19: 1000 mL

## 2015-10-19 MED ORDER — FENTANYL CITRATE (PF) 100 MCG/2ML IJ SOLN
INTRAMUSCULAR | Status: DC | PRN
  Administered 2015-10-19 (×2): 50 ug via INTRAVENOUS
  Administered 2015-10-19: 100 ug via INTRAVENOUS
  Administered 2015-10-19 (×2): 50 ug via INTRAVENOUS

## 2015-10-19 MED ORDER — CEFAZOLIN SODIUM 1 G IJ SOLR
INTRAMUSCULAR | Status: DC | PRN
  Administered 2015-10-19: 2 g via INTRAMUSCULAR

## 2015-10-19 MED ORDER — NEOSTIGMINE METHYLSULFATE 10 MG/10ML IV SOLN
INTRAVENOUS | Status: DC | PRN
  Administered 2015-10-19: 3 mg via INTRAVENOUS

## 2015-10-19 MED ORDER — HYDROMORPHONE HCL 1 MG/ML IJ SOLN
INTRAMUSCULAR | Status: AC
  Filled 2015-10-19: qty 1

## 2015-10-19 MED ORDER — SODIUM CHLORIDE 0.9 % IR SOLN
Status: DC | PRN
  Administered 2015-10-19 (×2): 1000 mL

## 2015-10-19 MED ORDER — SODIUM CHLORIDE 0.9 % IV SOLN
INTRAVENOUS | Status: DC | PRN
  Administered 2015-10-19: 9 mL

## 2015-10-19 MED ORDER — SODIUM CHLORIDE 0.9 % IV SOLN
INTRAVENOUS | Status: DC
  Administered 2015-10-19: 20:00:00 via INTRAVENOUS

## 2015-10-19 MED ORDER — PROPOFOL 10 MG/ML IV BOLUS
INTRAVENOUS | Status: DC | PRN
  Administered 2015-10-19: 150 mg via INTRAVENOUS
  Administered 2015-10-19: 30 mg via INTRAVENOUS

## 2015-10-19 SURGICAL SUPPLY — 38 items
CANISTER SUCTION 2500CC (MISCELLANEOUS) ×3 IMPLANT
CATH CHOLANG 76X19 KUMAR (CATHETERS) ×3 IMPLANT
CHLORAPREP W/TINT 26ML (MISCELLANEOUS) ×3 IMPLANT
CLIP LIGATING HEMO LOK XL GOLD (MISCELLANEOUS) ×6 IMPLANT
COVER SURGICAL LIGHT HANDLE (MISCELLANEOUS) ×3 IMPLANT
DRAPE C-ARM 42X72 X-RAY (DRAPES) ×3 IMPLANT
ELECT REM PT RETURN 9FT ADLT (ELECTROSURGICAL) ×3
ELECTRODE REM PT RTRN 9FT ADLT (ELECTROSURGICAL) ×1 IMPLANT
GLOVE BIOGEL PI IND STRL 6.5 (GLOVE) ×1 IMPLANT
GLOVE BIOGEL PI IND STRL 7.0 (GLOVE) ×1 IMPLANT
GLOVE BIOGEL PI INDICATOR 6.5 (GLOVE) ×2
GLOVE BIOGEL PI INDICATOR 7.0 (GLOVE) ×2
GLOVE ECLIPSE 6.5 STRL STRAW (GLOVE) ×3 IMPLANT
GLOVE SURG SS PI 7.0 STRL IVOR (GLOVE) ×3 IMPLANT
GOWN STRL REUS W/ TWL LRG LVL3 (GOWN DISPOSABLE) ×4 IMPLANT
GOWN STRL REUS W/TWL LRG LVL3 (GOWN DISPOSABLE) ×8
GRASPER SUT TROCAR 14GX15 (MISCELLANEOUS) ×3 IMPLANT
IRRIG SUCT STRYKERFLOW 2 WTIP (MISCELLANEOUS) ×3
IRRIGATION SUCT STRKRFLW 2 WTP (MISCELLANEOUS) ×1 IMPLANT
KIT BASIN OR (CUSTOM PROCEDURE TRAY) ×3 IMPLANT
KIT ROOM TURNOVER OR (KITS) ×3 IMPLANT
LIQUID BAND (GAUZE/BANDAGES/DRESSINGS) ×3 IMPLANT
NS IRRIG 1000ML POUR BTL (IV SOLUTION) ×3 IMPLANT
PAD ARMBOARD 7.5X6 YLW CONV (MISCELLANEOUS) ×3 IMPLANT
POUCH RETRIEVAL ECOSAC 10 (ENDOMECHANICALS) ×1 IMPLANT
POUCH RETRIEVAL ECOSAC 10MM (ENDOMECHANICALS) ×2
SCISSORS LAP 5X35 DISP (ENDOMECHANICALS) ×3 IMPLANT
SET IRRIG TUBING LAPAROSCOPIC (IRRIGATION / IRRIGATOR) ×3 IMPLANT
SLEEVE ENDOPATH XCEL 5M (ENDOMECHANICALS) ×6 IMPLANT
SPECIMEN JAR SMALL (MISCELLANEOUS) ×3 IMPLANT
STOPCOCK 4 WAY LG BORE MALE ST (IV SETS) ×3 IMPLANT
SUT MNCRL AB 4-0 PS2 18 (SUTURE) ×3 IMPLANT
TOWEL OR 17X24 6PK STRL BLUE (TOWEL DISPOSABLE) ×3 IMPLANT
TOWEL OR 17X26 10 PK STRL BLUE (TOWEL DISPOSABLE) ×3 IMPLANT
TRAY LAPAROSCOPIC MC (CUSTOM PROCEDURE TRAY) ×3 IMPLANT
TROCAR XCEL 12X100 BLDLESS (ENDOMECHANICALS) ×3 IMPLANT
TROCAR XCEL NON-BLD 5MMX100MML (ENDOMECHANICALS) ×3 IMPLANT
TUBING INSUFFLATION (TUBING) ×3 IMPLANT

## 2015-10-19 NOTE — Progress Notes (Signed)
Patient is now refusing to go to the OR due to her disposition. Pt stated "I will not have surgery if [facility] CSW is in room now. RN explained to her that she needs to discuss this with the surgeon r/t her surgical procedure today.   Sim BoastHavy, RN

## 2015-10-19 NOTE — Clinical Social Work Note (Signed)
Clinical Social Work Assessment  Patient Details  Name: Anita Harrington MRN: [id number] Date of Birth: [birth year]  Date of referral:  10/19/15               Reason for consult:  Discharge Planning                Permission sought to share information with:  Case Manager Permission granted to share information::  Yes, Verbal Permission Granted  Name::     Brita Romp  Agency::  [facility]  Relationship::  case Primary school teacher Information:  [phone]  Housing/Transportation Living arrangements for the past 2 months:   Tax adviser) Source of Information:   Patient Patient Interpreter Needed:  None Criminal Activity/Legal Involvement Pertinent to Current Situation/Hospitalization:  No - Comment as needed Significant Relationships:  Other Family Members Lives with:   Treatment facility Do you feel safe going back to the place where you live?  Yes Need for family participation in patient care:  No (Coment)  Care giving concerns:  Pt will likely be on narcotic pain medication at discharge and is unable to return to treatment facility while take prescribed narcotic pain medication.   Social Worker assessment / plan:  CSW met with pt to address consult discharge planning needs. CSW introduced herself and explained role of social work. Pt shared that she been in alcohol abuse treatment at [place] for the 2 months for alcohol abuse. Pt is in need of surgery for her gallbladder. Pt stated that she is unable to return to treatment if she is taking narcotic pain medication. Pt requested that CSW call Brita Romp at [facility] to coordinator her aftercare. Pt would not give consent for surgery until this is settled.   CSW spoke with [facility]. Pt has completed her treatment and is currently living on campus in supported living while attending IOP. Since they are unable to accept pt post-op and will likely have narcotics prescribed for pain control, they will  allow her to return home and then readmit to [facility] where she will got to the medical unit for observation overnight. After that, pt will return to her supported living on campus. CSW updated MD. Pt is now agreeable to surgery. Plan is for pt to discharge tomorrow. CSW will continue to follow.  Employment status:  Other (Comment) Insurance information:  Managed Care PT Recommendations:  Not assessed at this time Information / Referral to community resources:  Other (Comment Required) ([facility])  Patient/Family's Response to care:  Pt was appreciative of CSW assistance.   Patient/Family's Understanding of and Emotional Response to Diagnosis, Current Treatment, and Prognosis:  Pt demonstrated understanding of guidelines of the treatment facility, and attempting to advocate for herself in order to address herself se  Emotional Assessment Appearance:  Appears stated age Attitude/Demeanor/Rapport:   (Appropriate) Affect (typically observed):  Accepting, Pleasant Orientation:  Oriented to Self, Oriented to Place, Oriented to  Time, Oriented to Situation Alcohol / Substance use:  Alcohol Use Psych involvement (Current and /or in the community):  No (Comment)  Discharge Needs  Concerns to be addressed:  Care Coordination Readmission within the last 30 days:  No Current discharge risk:  None Barriers to Discharge:  Active Substance Use   Darden Dates, LCSW 10/19/2015, 2:39 PM

## 2015-10-19 NOTE — OR Nursing (Signed)
10/19/2015 While patient was awakening in OR #1, still intubated, she was coughing. Noted that the superior, medial port site appeared to be "bulging" or herniated with the pressure from coughing. I applied gentle pressure while extubating. Patient was transported to PACU. I called Dr. Sheliah HatchKinsinger with a report. Dr. Sheliah HatchKinsinger went to PACU to assess the patient.

## 2015-10-19 NOTE — Progress Notes (Signed)
Patient denies h/a. Patient c/o 6/10 pain above umbilicus. 50 mcg fentanyl IV administered to patient.

## 2015-10-19 NOTE — Progress Notes (Signed)
Patient CHG bath completed at 0708. Patient states she is not able to speak with anyone from [facility] until 0800-1000. Patient encouraged to call asap. Patient inquired if she would be able to stay in hospital for 2-3 days post-op to manage pain then return to [facility] w/o any type of controlled substance to manage pain. Patient states the above scenerio would be ideal and she would have the surgery. Patient encouraged to discuss the above plan with MD. Report provided to oncoming nursing staff.

## 2015-10-19 NOTE — Op Note (Signed)
PATIENT:  Anita BeckStacey Harrington  40 y.o. female  PRE-OPERATIVE DIAGNOSIS:  Cholecystitis  POST-OPERATIVE DIAGNOSIS:  Cholecystitis  PROCEDURE:  Procedure(s): LAPAROSCOPIC CHOLECYSTECTOMY with Intraoperative Cholangiogram   SURGEON:  Surgeon(s): De BlanchLuke Aaron Moyinoluwa Dawe, MD  ASSISTANT: none  ANESTHESIA:   local and general  Indications for procedure: Anita BeckStacey Harrington is a 40 y.o. female with symptoms of Abdominal pain and RUQ pain consistent with gallbladder disease, Confirmed by Ultrasound and CT. She had pancreatitis related to gallstones and is now clinically improved.  Description of procedure: The patient was brought into the operative suite, placed supine. Anesthesia was administered with endotracheal tube. Patient was strapped in place and foot board was secured. All pressure points were offloaded by foam padding. The patient was prepped and draped in the usual sterile fashion.  A small incision was made to the right of the umbilicus. A 5mm trocar was inserted into the peritoneal cavity with optical entry. Pneumoperitoneum was applied with high flow low pressure. 2 5mm trocars were placed in the RUQ. A 12mm trocar was placed in the subxiphoid space. All trocars sites were first anesthesized with 0.25% marcaine with epinephrine in the subcutaneous and preperitoneal layers. Next the patient was placed in reverse trendelenberg. The gallbladder was long and inflamed.  The gallbladder was retracted cephalad and lateral. The peritoneum was reflected off the infundibulum working lateral to medial. The cystic duct and cystic artery were identified and further dissection revealed a critical view, due to concern for choledocholithiasis a cholangiogram was performed with Rolene ArbourKumar Clamp. Normal ductal anatomy was viewed without filling defect. The cystic duct and cystic artery were doubly clipped and ligated.   The gallbladder was removed off the liver bed with cautery. The Gallbladder was placed in a specimen  bag. The gallbladder fossa was irrigated and hemostasis was applied with cautery. The gallbladder was removed via the 12mm trocar. No dilation was required for removal, therefore no fascial closure was performed. Pneumoperitoneum was removed, all trocar were removed. All incisions were closed with 4-0 monocryl subcuticular stitch. The patient woke from anesthesia and was brought to PACU in stable condition. All counts were correct  Findings: normal ductal anatomy. Mildly inflamed gallbladder  Specimen: gallbladder  Blood loss: Total I/O In: 100 [IV Piggyback:100] Out: -  ml  Local anesthesia: 10 ml 0.25% marcaine with epinephrine  Complications: none  PLAN OF CARE: Admit to inpatient   PATIENT DISPOSITION:  PACU - hemodynamically stable.  Feliciana RossettiLuke Asianae Minkler, M.D. General, Bariatric, & Minimally Invasive Surgery [facility]

## 2015-10-19 NOTE — Progress Notes (Signed)
[facility] Progress Note     Subjective: Epigastric pain currently 5/10, not radiating to back. Patient wants to eat. States she would like to have her gallbladder removed if she has a place to go after discharge from the hospital where she can take pain medication if needed.   Objective: Vital signs in last 24 hours: Temp:  [97.9 F (36.6 C)-98.7 F (37.1 C)] 98.6 F (37 C) (08/24 0900) Pulse Rate:  [60-79] 60 (08/24 0900) Resp:  [12-18] 18 (08/24 0500) BP: (107-120)/(65-74) 116/67 (08/24 0900) SpO2:  [97 %-99 %] 97 % (08/24 0900) Last BM Date: 10/18/15  Intake/Output from previous day: 08/23 0701 - 08/24 0700 In: 120 [P.O.:120] Out: -  Intake/Output this shift: Total I/O In: 100 [IV Piggyback:100] Out: -   PE: Gen:  Alert, NAD, cooperative Card:  RRR, no M/G/R  Pulm:  CTA, no W/R/R Abd: Soft, TTP epigastrium, ND, +BS,   Lab Results:   Recent Labs  10/17/15 0655 10/19/15 0333  WBC 8.0 8.4  HGB 11.3* 11.8*  HCT 35.2* 36.4  PLT 155 170   BMET  Recent Labs  10/18/15 0537 10/19/15 0333  NA 138 138  K 3.6 3.8  CL 107 107  CO2 21* 26  GLUCOSE 72 139*  BUN 7 8  CREATININE 0.66 0.72  CALCIUM 9.0 9.0   PT/INR No results for input(s): LABPROT, INR in the last 72 hours. CMP     Component Value Date/Time   NA 138 10/19/2015 0333   K 3.8 10/19/2015 0333   CL 107 10/19/2015 0333   CO2 26 10/19/2015 0333   GLUCOSE 139 (H) 10/19/2015 0333   BUN 8 10/19/2015 0333   CREATININE 0.72 10/19/2015 0333   CALCIUM 9.0 10/19/2015 0333   PROT 6.0 (L) 10/19/2015 0333   ALBUMIN 3.1 (L) 10/19/2015 0333   AST 22 10/19/2015 0333   ALT 89 (H) 10/19/2015 0333   ALKPHOS 97 10/19/2015 0333   BILITOT 0.3 10/19/2015 0333   GFRNONAA >60 10/19/2015 0333   GFRAA >60 10/19/2015 0333   Lipase     Component Value Date/Time   LIPASE 154 (H) 10/19/2015 0333       Studies/Results: No results found.  Anti-infectives: Anti-infectives    Start      Dose/Rate Route Frequency Ordered Stop   10/17/15 0900  imipenem-cilastatin (PRIMAXIN) 500 mg in sodium chloride 0.9 % 100 mL IVPB  Status:  Discontinued     500 mg 200 mL/hr over 30 Minutes Intravenous Every 6 hours 10/17/15 0827 10/17/15 0830   10/17/15 0900  imipenem-cilastatin (PRIMAXIN) 500 mg in sodium chloride 0.9 % 100 mL IVPB     500 mg 200 mL/hr over 30 Minutes Intravenous Every 6 hours 10/17/15 0830         Assessment/Plan Gallstone pancreatitis  - abdominal tenderness improving but present, LFT's and lipase continue to improve.  Toni Amend- Courtney from CSW seeing the patient today to discuss options for housing after discharge. Appreciate CSW seeing the patient to help facilitate this.  -NPO: laparoscopic cholecystectomy today with Dr. Sheliah HatchKinsinger   LOS: 2 days    Adam PhenixElizabeth S Adlai Sinning , [facility] 10/19/2015, 1:10 PM Pager: [phone] Consults: [phone] Mon-Fri 7:00 am-4:30 pm Sat-Sun 7:00 am-11:30 am

## 2015-10-19 NOTE — Progress Notes (Signed)
Attending MD, general surgery PA, along with RN at bedside today updated POC with pt. CSW at bedside.    Sim BoastHavy, RN

## 2015-10-19 NOTE — Anesthesia Procedure Notes (Signed)
Procedure Name: Intubation Date/Time: 10/19/2015 3:48 PM Performed by: Glo HerringLEE, Aspen Deterding B Pre-anesthesia Checklist: Patient identified, Emergency Drugs available, Suction available, Patient being monitored and Timeout performed Patient Re-evaluated:Patient Re-evaluated prior to inductionOxygen Delivery Method: Circle system utilized Preoxygenation: Pre-oxygenation with 100% oxygen Intubation Type: IV induction Ventilation: Mask ventilation without difficulty Laryngoscope Size: Mac and 3 Grade View: Grade I Tube type: Oral Tube size: 7.0 mm Number of attempts: 1 Airway Equipment and Method: Stylet Placement Confirmation: ETT inserted through vocal cords under direct vision,  positive ETCO2,  CO2 detector and breath sounds checked- equal and bilateral Secured at: 21 cm Tube secured with: Tape Dental Injury: Teeth and Oropharynx as per pre-operative assessment

## 2015-10-19 NOTE — Progress Notes (Signed)
Consent signed and in chart.   Dashawn Golda, RN 

## 2015-10-19 NOTE — Transfer of Care (Signed)
Immediate Anesthesia Transfer of Care Note  Patient: Anita BeckStacey Harrington  Procedure(s) Performed: Procedure(s): LAPAROSCOPIC CHOLECYSTECTOMY with Intraoperative Cholangiogram (N/A)  Patient Location: PACU  Anesthesia Type:General  Level of Consciousness: awake  Airway & Oxygen Therapy: Patient Spontanous Breathing and Patient connected to nasal cannula oxygen  Post-op Assessment: Report given to RN and Post -op Vital signs reviewed and stable  Post vital signs: Reviewed and stable  Last Vitals:  Vitals:   10/19/15 0900 10/19/15 1300  BP: 116/67 (!) 143/85  Pulse: 60 61  Resp: 18 18  Temp: 37 C 37.2 C    Last Pain:  Vitals:   10/19/15 1300  TempSrc: Oral  PainSc:       Patients Stated Pain Goal: 2 (10/19/15 1148)  Complications: No apparent anesthesia complications

## 2015-10-19 NOTE — Progress Notes (Signed)
PROGRESS NOTE  Anita Harrington ZOX:[id number] DOB: August 17, 1975 DOA: 10/16/2015 PCP: No PCP Per Patient  Brief History:  40 year old female with a history of gastric bypass, anxiety, depression, polysubstance abuse previously on suboxone 10 years ago presented with one-day history of abdominal pain in epigastric and right upper quadrant area. She has some nausea without emesis. She denied any fevers, chills, chest pain, shortness breath, diarrhea. Right upper quadrant ultrasound on 10/16/2015 showed cholelithiasis without biliary ductal dilatation. The patient was started on intravenous antibiotics and intravenous fluids. General surgery was consulted to assist with management.  Assessment/Plan: Biliary Pancreatitis -RUQ US--cholelithiasis without signs of cholecystitis -empiric antibiotics--continue imipenem. No clinical features to suggest complicating factors associated with pancreatitis i.e. necrosis in the absence of high fevers, leukocytosis or toxic looking patient. Had been started on empiric imipenem on admission. Now status post laparoscopic cholecystectomy which showed mild cholecystitis. Continue imipenem overnight, discussed with surgeons regarding need for antibiotics in a.m. from a surgical perspective and if not needed then DC imipenem. -lipase 1269 >750 >154.  -npo for now-was awaiting surgery this morning. - Gen. surgery was consulted and patient underwent laparoscopic cholecystectomy 8/24.  Acute cholecystitis - Patient underwent laparoscopic cholecystectomy on 8/24 which showed mildly inflamed gallbladder. Management per general surgery.  Transaminasemia -LFTs have nearly normalized -check hep B and C-negative -check HIV: Non-reactive  Anxiety/Depression -continue lexapro, trazodone  Polysubstance abuse history (opiates, benzo, cocaine, Etoh) -See note from psychiatry--Dr. Margo Aye on 02/03/15 on Care Everywhere -"I have legitimate pain, and I don't want to be  labeled as narcotic seeking" -pt became annoyed when Dr. Arbutus Leas addressed her previous history of substance abuse--"where did you get that information?" -pain appears to be out of proportion with exam and objective findings. Please see discussion about pain management above. - Patient reported pain continues to be out of proportion to physical findings. Judicious use of opioid pain medications and avoid escalating.  Tobacco abuse -cessation discussed  Hypokalemia - Replaced  Anemia - Follow CBC.    Disposition Plan:   Home in 1-2 days  Family Communication:  No Family at bedside. Discussed extensively with patient.  Consultants:  General surgery  Code Status:  FULL  DVT Prophylaxis:  SCDs   Procedures: As Listed in Progress Note Above  Antibiotics: None    Subjective: Patient seen this morning prior to surgical procedure. No abdominal pain reported. Patient seen along with her nurse and to nursing students in the room. She was still contemplating whether to have surgery at that time.  Objective: Vitals:   10/18/15 1703 10/18/15 2100 10/19/15 0100 10/19/15 0500  BP: 120/65 107/74 114/74 111/68  Pulse: 75 77 74 72  Resp: 16 16 16 18   Temp: 97.9 F (36.6 C) 98.5 F (36.9 C) 98.5 F (36.9 C) 98.7 F (37.1 C)  TempSrc: Oral Oral Oral Oral  SpO2: 98% 98% 98% 98%  Weight:      Height:        Intake/Output Summary (Last 24 hours) at 10/19/15 0718 Last data filed at 10/18/15 2357  Gross per 24 hour  Intake              120 ml  Output                0 ml  Net              120 ml   Weight change:  Exam:   General:  Pt is alert,  follows commands appropriately, not in acute distress  HEENT: No icterus, No thrush, No neck mass, Sanders/AT  Cardiovascular: RRR, S1/S2, no rubs, no gallops  Respiratory: CTA bilaterally, no wheezing, no crackles, no rhonchi  Abdomen: Soft/+BS, RUQ and epigastric pain, non distended, no guarding. No peritoneal signs.  Extremities: No  edema, No lymphangitis, No petechiae, No rashes, no synovitis   Data Reviewed: I have personally reviewed following labs and imaging studies Basic Metabolic Panel:  Recent Labs Lab 10/16/15 2001 10/17/15 0655 10/18/15 0537 10/19/15 0333  NA 132* 136 138 138  K 3.4* 3.3* 3.6 3.8  CL 100* 104 107 107  CO2 26 25 21* 26  GLUCOSE 106* 112* 72 139*  BUN 9 9 7 8   CREATININE 0.71 0.62 0.66 0.72  CALCIUM 9.1 8.8* 9.0 9.0   Liver Function Tests:  Recent Labs Lab 10/16/15 2001 10/17/15 0655 10/18/15 0537 10/19/15 0333  AST 224* 102* 37 22  ALT 320* 203* 127* 89*  ALKPHOS 130* 106 103 97  BILITOT 1.1 0.7 0.9 0.3  PROT 7.3 5.9* 6.0* 6.0*  ALBUMIN 4.3 3.5 3.3* 3.1*    Recent Labs Lab 10/16/15 2001 10/17/15 0655 10/19/15 0333  LIPASE 1,269* 750* 154*   No results for input(s): AMMONIA in the last 168 hours. Coagulation Profile: No results for input(s): INR, PROTIME in the last 168 hours. CBC:  Recent Labs Lab 10/16/15 2001 10/17/15 0655 10/19/15 0333  WBC 11.1* 8.0 8.4  HGB 13.4 11.3* 11.8*  HCT 40.7 35.2* 36.4  MCV 98.1 98.1 97.8  PLT 213 155 170   Cardiac Enzymes: No results for input(s): CKTOTAL, CKMB, CKMBINDEX, TROPONINI in the last 168 hours. BNP: Invalid input(s): POCBNP CBG: No results for input(s): GLUCAP in the last 168 hours. HbA1C: No results for input(s): HGBA1C in the last 72 hours. Urine analysis:    Component Value Date/Time   COLORURINE YELLOW 10/16/2015 2121   APPEARANCEUR CLOUDY (A) 10/16/2015 2121   LABSPEC 1.016 10/16/2015 2121   PHURINE 7.0 10/16/2015 2121   GLUCOSEU NEGATIVE 10/16/2015 2121   HGBUR NEGATIVE 10/16/2015 2121   BILIRUBINUR NEGATIVE 10/16/2015 2121   KETONESUR 15 (A) 10/16/2015 2121   PROTEINUR NEGATIVE 10/16/2015 2121   UROBILINOGEN 2.0 (H) 10/16/2015 1853   NITRITE NEGATIVE 10/16/2015 2121   LEUKOCYTESUR NEGATIVE 10/16/2015 2121   Sepsis Labs: @LABRCNTIP (procalcitonin:4,lacticidven:4) )No results found for  this or any previous visit (from the past 240 hour(s)).   Scheduled Meds: . escitalopram  20 mg Oral Daily  . gabapentin  300 mg Oral TID  . imipenem-cilastatin  500 mg Intravenous Q6H  . nicotine  21 mg Transdermal Daily  . traZODone  200 mg Oral QHS   Continuous Infusions:    Procedures/Studies: Mr Abdomen Mrcp Wo Cm  Result Date: 10/17/2015 CLINICAL DATA:  Gallstones and biliary dilatation. EXAM: MRI ABDOMEN WITHOUT CONTRAST  (INCLUDING MRCP) TECHNIQUE: Multiplanar multisequence MR imaging of the abdomen was performed. Heavily T2-weighted images of the biliary and pancreatic ducts were obtained, and three-dimensional MRCP images were rendered by post processing. COMPARISON:  10/17/2015 FINDINGS: Lower chest:  No acute findings. Hepatobiliary: No mass visualized on this unenhanced exam. Small stones within the gallbladder measuring up to 9 mm. No gallbladder wall thickening. The common bile duct measures up to 5 mm, image 27 of series 4. Mild intrahepatic biliary prominence. No choledocholithiasis identified. Pancreas: No mass or inflammatory process visualized on this unenhanced exam. Spleen:  Within normal limits in size. Adrenals/Urinary Tract: No adrenal mass identified. No evidence of  renal mass or hydronephrosis. Stomach/Bowel:  Unremarkable. Vascular/Lymphatic: No pathologically enlarged lymph nodes identified. No evidence of abdominal aortic aneurysm. Other:  None. Musculoskeletal:  No suspicious bone lesions identified. IMPRESSION: 1. Gallstones. 2. No significant bile duct dilatation and no evidence for choledocholithiasis. Electronically Signed   By: Signa Kellaylor  Stroud M.D.   On: 10/17/2015 12:54   [place] Abdomen Limited Ruq  Result Date: 10/17/2015 CLINICAL DATA:  Right upper quadrant abdominal pain EXAM: US ABDOMEN LIMITED - RIGHT UPPER QUADRANT COMPARISON:  None. FINDINGS: Gallbladder: Multiple shadowing gallstones. Gallbladder reportedly tender but there is partial distention and no wall  thickening or pericholecystic edema Common bile duct: Diameter: 3 mm.  Where visualized, no filling defect. Liver: No focal lesion identified. Within normal limits in parenchymal echogenicity. IMPRESSION: Cholelithiasis. The gallbladder is tender but there is no distension or wall inflammation to suggest acute cholecystitis. Electronically Signed   By: Marnee SpringJonathon  Watts M.D.   On: 10/17/2015 01:30    Dore Oquin, MD, FACP, FHM. [facility] Pager [phone]  If 7PM-7AM, please contact night-coverage [email or web address] Password [email or web address] 10/19/2015, 7:18 AM   LOS: 2 days

## 2015-10-19 NOTE — Progress Notes (Signed)
Pt arrived to 5M20 via bed.  Alert and oriented, c/o pain 7/10, playing on cell phone, calm demeanor, VSS.  Will continue to monitor.  Sondra ComeSilva, Nijee Heatwole M, RN

## 2015-10-19 NOTE — Anesthesia Preprocedure Evaluation (Addendum)
Anesthesia Evaluation  Patient identified by MRN, date of birth, ID band Patient awake    Reviewed: Allergy & Precautions, NPO status , Patient's Chart, lab work & pertinent test results  Airway Mallampati: II  TM Distance: >3 FB Neck ROM: Full    Dental  (+) Teeth Intact, Dental Advisory Given, Poor Dentition, Chipped   Pulmonary neg pulmonary ROS, Current Smoker,    Pulmonary exam normal breath sounds clear to auscultation       Cardiovascular Exercise Tolerance: Good negative cardio ROS Normal cardiovascular exam Rhythm:Regular Rate:Normal     Neuro/Psych PSYCHIATRIC DISORDERS Anxiety Depression negative neurological ROS     GI/Hepatic (+)     substance abuse (polysubstance abuse history)  alcohol use, pancreatitis with cholelithiasis and dilation of CBD concerning for gallstone pancreatitis S/p Lap Roux-en-y bypass   Endo/Other  negative endocrine ROS  Renal/GU negative Renal ROS     Musculoskeletal negative musculoskeletal ROS (+)   Abdominal   Peds  Hematology  (+) Blood dyscrasia, anemia ,   Anesthesia Other Findings Day of surgery medications reviewed with the patient.  Reproductive/Obstetrics                          Anesthesia Physical Anesthesia Plan  ASA: II  Anesthesia Plan: General   Post-op Pain Management:    Induction: Intravenous  Airway Management Planned: Oral ETT  Additional Equipment:   Intra-op Plan:   Post-operative Plan: Extubation in OR  Informed Consent: I have reviewed the patients History and Physical, chart, labs and discussed the procedure including the risks, benefits and alternatives for the proposed anesthesia with the patient or authorized representative who has indicated his/her understanding and acceptance.   Dental advisory given  Plan Discussed with: CRNA  Anesthesia Plan Comments: (Risks/benefits of general anesthesia discussed with  patient including risk of damage to teeth, lips, gum, and tongue, nausea/vomiting, allergic reactions to medications, and the possibility of heart attack, stroke and death.  All patient questions answered.  Patient wishes to proceed.)        Anesthesia Quick Evaluation

## 2015-10-19 NOTE — Progress Notes (Signed)
Pt rates abd pain 8/10 after 150mcg IV Fentanyl, IV Ofirmev and po OXY IR. She doesn't "think the medicine is doing anything". Dr Desmond Lopeurk updated-new order for 0.5 mg IV Dilaudid. Will continue to monitor closely.

## 2015-10-20 ENCOUNTER — Encounter (HOSPITAL_COMMUNITY): Payer: Self-pay | Admitting: General Surgery

## 2015-10-20 LAB — LIPASE, BLOOD: Lipase: 18 U/L (ref 11–51)

## 2015-10-20 MED ORDER — FENTANYL CITRATE (PF) 100 MCG/2ML IJ SOLN: 25.0000 ug | INTRAMUSCULAR | Status: DC | PRN

## 2015-10-20 MED ORDER — HYDROCODONE-ACETAMINOPHEN 5-325 MG PO TABS: 1.0000 | ORAL_TABLET | Freq: Four times a day (QID) | ORAL | 0 refills | Status: DC | PRN

## 2015-10-20 MED ORDER — ADULT MULTIVITAMIN W/MINERALS CH
1.0000 | ORAL_TABLET | Freq: Every day | ORAL | Status: DC
  Administered 2015-10-20: 1 via ORAL
  Filled 2015-10-20: qty 1

## 2015-10-20 MED ORDER — METHOCARBAMOL 500 MG PO TABS: 500.0000 mg | ORAL_TABLET | Freq: Three times a day (TID) | ORAL | 0 refills | Status: DC | PRN

## 2015-10-20 MED ORDER — HYDROCODONE-ACETAMINOPHEN 5-325 MG PO TABS
1.0000 | ORAL_TABLET | ORAL | Status: DC | PRN
  Administered 2015-10-20 (×2): 2 via ORAL
  Filled 2015-10-20 (×2): qty 2

## 2015-10-20 MED ORDER — METHOCARBAMOL 750 MG PO TABS
750.0000 mg | ORAL_TABLET | Freq: Four times a day (QID) | ORAL | Status: DC | PRN
  Administered 2015-10-20: 750 mg via ORAL
  Filled 2015-10-20: qty 1

## 2015-10-20 NOTE — Progress Notes (Signed)
Discharge instructions given to patient. Reviewed medications and side effects with patient as well as Rx given. Await for transport. No other questions at this time.   Brittney, Student Nurse (SN).

## 2015-10-20 NOTE — Progress Notes (Signed)
[facility] Progress Note  1 Day Post-Op  Subjective: POD#1. Pain currently 6/10. States that the pain medication wears off quickly. Ambulating. Tolerating clears. Denies nausea and vomiting. Plans to stay with a friend after discharge from the hospital and [facility]   Objective: Vital signs in last 24 hours: Temp:  [98 F (36.7 C)-98.9 F (37.2 C)] 98 F (36.7 C) (08/25 0526) Pulse Rate:  [58-81] 58 (08/25 0526) Resp:  [11-18] 16 (08/25 0526) BP: (114-158)/(61-85) 139/85 (08/25 0526) SpO2:  [93 %-100 %] 99 % (08/25 0526) Last BM Date: 10/18/15  Intake/Output from previous day: 08/24 0701 - 08/25 0700 In: 1590 [P.O.:290; I.V.:1200; IV Piggyback:100] Out: 25 [Blood:25] Intake/Output this shift: Total I/O In: 100 [IV Piggyback:100] Out: -   PE: Gen:  Alert, NAD, pleasant Card:  RRR, no M/G/R Pulm:  CTA, no W/R/R Abd: Soft, appropriately tender, still with some epigastric tenderness, ND, +BS, incisions C/D/I  Lab Results:   Recent Labs  10/19/15 0333  WBC 8.4  HGB 11.8*  HCT 36.4  PLT 170   BMET  Recent Labs  10/18/15 0537 10/19/15 0333  NA 138 138  K 3.6 3.8  CL 107 107  CO2 21* 26  GLUCOSE 72 139*  BUN 7 8  CREATININE 0.66 0.72  CALCIUM 9.0 9.0   PT/INR No results for input(s): LABPROT, INR in the last 72 hours. CMP     Component Value Date/Time   NA 138 10/19/2015 0333   K 3.8 10/19/2015 0333   CL 107 10/19/2015 0333   CO2 26 10/19/2015 0333   GLUCOSE 139 (H) 10/19/2015 0333   BUN 8 10/19/2015 0333   CREATININE 0.72 10/19/2015 0333   CALCIUM 9.0 10/19/2015 0333   PROT 6.0 (L) 10/19/2015 0333   ALBUMIN 3.1 (L) 10/19/2015 0333   AST 22 10/19/2015 0333   ALT 89 (H) 10/19/2015 0333   ALKPHOS 97 10/19/2015 0333   BILITOT 0.3 10/19/2015 0333   GFRNONAA >60 10/19/2015 0333   GFRAA >60 10/19/2015 0333   Lipase     Component Value Date/Time   LIPASE 154 (H) 10/19/2015 0333   Studies/Results: Dg  Cholangiogram Operative  Result Date: 10/19/2015 CLINICAL DATA:  Gallstones EXAM: INTRAOPERATIVE CHOLANGIOGRAM TECHNIQUE: Cholangiographic images from the C-arm fluoroscopic device were submitted for interpretation post-operatively. Please see the procedural report for the amount of contrast and the fluoroscopy time utilized. COMPARISON:  None. FINDINGS: Contrast fills the biliary tree and duodenum without filling defects in the common bile duct. IMPRESSION: Patent biliary tree. Electronically Signed   By: Jolaine ClickArthur  Hoss M.D.   On: 10/19/2015 16:56    Anti-infectives: Anti-infectives    Start     Dose/Rate Route Frequency Ordered Stop   10/17/15 0900  imipenem-cilastatin (PRIMAXIN) 500 mg in sodium chloride 0.9 % 100 mL IVPB  Status:  Discontinued     500 mg 200 mL/hr over 30 Minutes Intravenous Every 6 hours 10/17/15 0827 10/17/15 0830   10/17/15 0900  imipenem-cilastatin (PRIMAXIN) 500 mg in sodium chloride 0.9 % 100 mL IVPB     500 mg 200 mL/hr over 30 Minutes Intravenous Every 6 hours 10/17/15 0830       Assessment/Plan Gallstone pancreatitis  POD #1 laparoscopic cholecystectomy Dr. Franky MachoLuke Kinsinger (10/19/15) - abdominal tenderness still present but improved, LFT's and lipase (154) continue to improve - repeat lipase ordered - appreciate CSW seeing the patient to help facilitate placement after discharge, she has a place to go tonight if she  is stable for discharge - encourage PO pain control with NORCO q4 and tylenol, add robaxin, advance diet to full liquids for lunch  Dispo: discharge today vs tomorrow AM    LOS: 3 days    Adam Phenix , [facility] Surgery 10/20/2015, 8:45 AM Pager: [phone] Consults: [phone] Mon-Fri 7:00 am-4:30 pm Sat-Sun 7:00 am-11:30 am

## 2015-10-20 NOTE — Clinical Social Work Note (Signed)
Pt is ready for discharge today and will return home to stay with her father while she recovers from surgery. Pt shared that she is having a friend pick her up and bring her to her father's home. Pt also shared that she has remained in contact with her sponsor while admitted and plans to do so when at home. [facility]. Pt reported that she is feeling better. Pt also feels encouraged to maintain her recovery. CSW provided supportive counseling. CSW is signing off as no further needs identified.   Dede QuerySarah Aquita Simmering, MSW, LCSW  Clinical Social Worker  [phone]

## 2015-10-20 NOTE — Anesthesia Postprocedure Evaluation (Signed)
Anesthesia Post Note  Patient: Anita BeckStacey Harrington  Procedure(s) Performed: Procedure(s) (LRB): LAPAROSCOPIC CHOLECYSTECTOMY with Intraoperative Cholangiogram (N/A)  Patient location during evaluation: PACU Anesthesia Type: General Level of consciousness: awake and alert Pain management: pain level controlled Vital Signs Assessment: post-procedure vital signs reviewed and stable Respiratory status: spontaneous breathing, nonlabored ventilation, respiratory function stable and patient connected to nasal cannula oxygen Cardiovascular status: blood pressure returned to baseline and stable Postop Assessment: no signs of nausea or vomiting Anesthetic complications: no     Last Vitals:  Vitals:   10/20/15 0101 10/20/15 0526  BP: 124/74 139/85  Pulse: 63 (!) 58  Resp: 16 16  Temp: 37.1 C 36.7 C    Last Pain:  Vitals:   10/20/15 0532  TempSrc:   PainSc: 6    Pain Goal: Patients Stated Pain Goal: 5 (10/19/15 1900)               Cecile HearingStephen Edward Turk

## 2015-10-20 NOTE — Discharge Instructions (Signed)
Laparoscopic Cholecystectomy, Care After Refer to this sheet in the next few weeks. These instructions provide you with information about caring for yourself after your procedure. Your health care provider may also give you more specific instructions. Your treatment has been planned according to current medical practices, but problems sometimes occur. Call your health care provider if you have any problems or questions after your procedure. WHAT TO EXPECT AFTER THE PROCEDURE After your procedure, it is common to have:  Pain at your incision sites. You will be given pain medicines to control your pain.  Mild nausea or vomiting. This should improve after the first 24 hours.  Bloating and possible shoulder pain from the gas that was used during the procedure. This will improve after the first 24 hours. HOME CARE INSTRUCTIONS Incision Care  Follow instructions from your health care provider about how to take care of your incisions. Make sure you:  Wash your hands with soap and water before you change your bandage (dressing). If soap and water are not available, use hand sanitizer.  Change your dressing as told by your health care provider.  Leave stitches (sutures), skin glue, or adhesive strips in place. These skin closures may need to be in place for 2 weeks or longer. If adhesive strip edges start to loosen and curl up, you may trim the loose edges. Do not remove adhesive strips completely unless your health care provider tells you to do that.  Do not take baths, swim, or use a hot tub until your health care provider approves. Ask your health care provider if you can take showers. You may only be allowed to take sponge baths for bathing. General Instructions  Take over-the-counter and prescription medicines only as told by your health care provider.  Do not drive or operate heavy machinery while taking prescription pain medicine.  Return to your normal diet as told by your health care  provider.  Do not lift anything that is heavier than 10 lb (4.5 kg).  Do not play contact sports for one week or until your health care provider approves. SEEK MEDICAL CARE IF:   You have redness, swelling, or pain at the site of your incision.  You have fluid, blood, or pus coming from your incision.  You notice a bad smell coming from your incision area.  Your surgical incisions break open.  You have a fever. SEEK IMMEDIATE MEDICAL CARE IF:  You develop a rash.  You have difficulty breathing.  You have chest pain.  You have increasing pain in your shoulders (shoulder strap areas).  You faint or have dizzy episodes while you are standing.  You have severe pain in your abdomen.  You have nausea or vomiting that lasts for more than one day.   This information is not intended to replace advice given to you by your health care provider. Make sure you discuss any questions you have with your health care provider.   Document Released: 02/11/2005 Document Revised: 11/02/2014 Document Reviewed: 09/23/2012 Elsevier Interactive Patient Education 2016 [employer]  Pain Medicine Instructions HOW CAN PAIN MEDICINE AFFECT ME?  You were given a prescription for pain medicine. This medicine may make you tired or drowsy and may affect your ability to think clearly. Pain medicine may also affect your ability to drive or perform certain physical activities. It may not be possible to make all of your pain go away, but you should be comfortable enough to move, breathe, and take care of yourself. HOW OFTEN SHOULD  I TAKE PAIN MEDICINE AND HOW MUCH SHOULD I TAKE?  Take pain medicine only as directed by your health care provider and only as needed for pain.  You do not need to take pain medicine if you are not having pain, unless directed by your health care provider.  You can take less than the prescribed dose if you find that a smaller amount of medicine controls your pain. WHAT  RESTRICTIONS DO I HAVE WHILE TAKING PAIN MEDICINE? Follow these instructions after you start taking pain medicine, while you are taking the medicine, and for 8 hours after you stop taking the medicine:  Do not drive.  Do not operate machinery.  Do not operate power tools.  Do not sign legal documents.  Do not drink alcohol.  Do not take sleeping pills.  Do not supervise children by yourself.  Do not participate in activities that require climbing or being in high places.  Do not enter a body of water--such as a lake, river, ocean, spa, or swimming pool--without an adult nearby who can monitor and help you. HOW CAN I KEEP OTHERS SAFE WHILE I AM TAKING PAIN MEDICINE?  Store your pain medicine as directed by your health care provider. Make sure that it is placed where children and pets cannot reach it.  Never share your pain medicine with anyone.  Do not save any leftover pills. If you have any leftover pain medicine, get rid of it or destroy it as directed by your health care provider. WHAT ELSE DO I NEED TO KNOW ABOUT TAKING PAIN MEDICINE?  Use a stool softener if you become constipated from your pain medicine. Increasing your intake of fruits and vegetables will also help with constipation.  Write down the times when you take your pain medicine. Look at the times before you take your next dose of medicine. It is easy to become confused while on pain medicine. Recording the times helps you to avoid an overdose.  If your pain is severe, do not try to treat it yourself by taking more pills than instructed on your prescription. Contact your health care provider for help.  You may have been prescribed a pain medicine that contains acetaminophen. Do not take any other acetaminophen while taking this medicine. An overdose of acetaminophen can result in severe liver damage. Acetaminophen is found in many over-the-counter (OTC) and prescription medicines. If you are taking any medicines in  addition to your pain medicine, check the active ingredients on those medicines to see if acetaminophen is listed. WHEN SHOULD I CALL MY HEALTH CARE PROVIDER?  Your medicine is not helping to make the pain go away.  You vomit or have diarrhea shortly after taking the medicine.  You develop new pain in areas that did not hurt before.  You have an allergic reaction to your medicine. This may include: ? Itchiness. ? Swelling. ? Dizziness. ? Developing a new rash. WHEN SHOULD I CALL 911 OR GO TO THE EMERGENCY ROOM?  You feel dizzy or you faint.  You are very confused or disoriented.  You repeatedly vomit.  Your skin or lips turn pale or bluish in color.  You have shortness of breath or you are breathing much more slowly than usual.  You have a severe allergic reaction to your medicine. This includes: ? Developing tongue swelling. ? Having difficulty breathing.  This information is not intended to replace advice given to you by your health care provider. Make sure you discuss any questions you have with  your health care provider.  Document Released: 05/20/2000 Document Revised: 06/28/2014 Document Reviewed: 12/16/2013 Elsevier Interactive Patient Education 2016 [employer]    Acute Pancreatitis Acute pancreatitis is a disease in which the pancreas becomes suddenly inflamed. The pancreas is a large gland located behind your stomach. The pancreas produces enzymes that help digest food. The pancreas also releases the hormones glucagon and insulin that help regulate blood sugar. Damage to the pancreas occurs when the digestive enzymes from the pancreas are activated and begin attacking the pancreas before being released into the intestine. Most acute attacks last a couple of days and can cause serious complications. Some people become dehydrated and develop low blood pressure. In severe cases, bleeding into the pancreas can lead to shock and can be life-threatening. The lungs,  heart, and kidneys may fail. CAUSES  Pancreatitis can happen to anyone. In some cases, the cause is unknown. Most cases are caused by:  Alcohol abuse.  Gallstones. Other less common causes are:  Certain medicines.  Exposure to certain chemicals.  Infection.  Damage caused by an accident (trauma).  Abdominal surgery. SYMPTOMS   Pain in the upper abdomen that may radiate to the back.  Tenderness and swelling of the abdomen.  Nausea and vomiting. DIAGNOSIS  Your caregiver will perform a physical exam. Blood and stool tests may be done to confirm the diagnosis. Imaging tests may also be done, such as X-rays, CT scans, or an ultrasound of the abdomen. TREATMENT  Treatment usually requires a stay in the hospital. Treatment may include:  Pain medicine.  Fluid replacement through an intravenous line (IV).  Placing a tube in the stomach to remove stomach contents and control vomiting.  Not eating for 3 or 4 days. This gives your pancreas a rest, because enzymes are not being produced that can cause further damage.  Antibiotic medicines if your condition is caused by an infection.  Surgery of the pancreas or gallbladder. HOME CARE INSTRUCTIONS   Follow the diet advised by your caregiver. This may involve avoiding alcohol and decreasing the amount of fat in your diet.  Eat smaller, more frequent meals. This reduces the amount of digestive juices the pancreas produces.  Drink enough fluids to keep your urine clear or pale yellow.  Only take over-the-counter or prescription medicines as directed by your caregiver.  Avoid drinking alcohol if it caused your condition.  Do not smoke.  Get plenty of rest.  Check your blood sugar at home as directed by your caregiver.  Keep all follow-up appointments as directed by your caregiver. SEEK MEDICAL CARE IF:   You do not recover as quickly as expected.  You develop new or worsening symptoms.  You have persistent pain,  weakness, or nausea.  You recover and then have another episode of pain. SEEK IMMEDIATE MEDICAL CARE IF:   You are unable to eat or keep fluids down.  Your pain becomes severe.  You have a fever or persistent symptoms for more than 2 to 3 days.  You have a fever and your symptoms suddenly get worse.  Your skin or the white part of your eyes turn yellow (jaundice).  You develop vomiting.  You feel dizzy, or you faint.  Your blood sugar is high (over 300 mg/dL). MAKE SURE YOU:   Understand these instructions.  Will watch your condition.  Will get help right away if you are not doing well or get worse.   This information is not intended to replace advice given to you by  your health care provider. Make sure you discuss any questions you have with your health care provider.   Document Released: 02/11/2005 Document Revised: 08/13/2011 Document Reviewed: 05/23/2011 Elsevier Interactive Patient Education 2016 [employer] Inc.    Cholecystitis Cholecystitis is inflammation of the gallbladder. It is often called a gallbladder attack. The gallbladder is a pear-shaped organ that lies beneath the liver on the right side of the body. The gallbladder stores bile, which is a fluid that helps the body to digest fats. If bile builds up in your gallbladder, your gallbladder becomes inflamed. This condition may occur suddenly (be acute). Repeat episodes of acute cholecystitis or prolonged episodes may lead to a long-term (chronic) condition. Cholecystitis is serious and it requires treatment.  CAUSES The most common cause of this condition is gallstones. Gallstones can block the tube (duct) that carries bile out of your gallbladder. This causes bile to build up. Other causes of this condition include:  Damage to the gallbladder due to a decrease in blood flow.  Infections in the bile ducts.  Scars or kinks in the bile ducts.  Tumors in the liver, pancreas, or gallbladder. RISK FACTORS This  condition is more likely to develop in:  People who have sickle cell disease.  People who take birth control pills or use estrogen.  People who have alcoholic liver disease.  People who have liver cirrhosis.  People who have their nutrition delivered through a vein (parenteral nutrition).  People who do not eat or drink (do fasting) for a long period of time.  People who are obese.  People who have rapid weight loss.  People who are pregnant.  People who have increased triglyceride levels.  People who have pancreatitis. SYMPTOMS Symptoms of this condition include:  Abdominal pain, especially in the upper right area of the abdomen.  Abdominal tenderness or bloating.  Nausea.  Vomiting.  Fever.  Chills.  Yellowing of the skin and the whites of the eyes (jaundice). DIAGNOSIS This condition is diagnosed with a medical history and physical exam. You may also have other tests, including:  Imaging tests, such as:  An ultrasound of the gallbladder.  A CT scan of the abdomen.  A gallbladder nuclear scan (HIDA scan). This scan allows your health care provider to see the bile moving from your liver to your gallbladder and to your small intestine.  MRI.  Blood tests, such as:  A complete blood count, because the white blood cell count may be higher than normal.  Liver function tests, because some levels may be higher than normal with certain types of gallstones. TREATMENT Treatment may include:  Fasting for a certain amount of time.  IV fluids.  Medicine to treat pain or vomiting.  Antibiotic medicine.  Surgery to remove your gallbladder (cholecystectomy). This may happen immediately or at a later time. HOME CARE INSTRUCTIONS Home care will depend on your treatment. In general:  Take over-the-counter and prescription medicines only as told by your health care provider.  If you were prescribed an antibiotic medicine, take it as told by your health care  provider. Do not stop taking the antibiotic even if you start to feel better.  Follow instructions from your health care provider about what to eat or drink. When you are allowed to eat, avoid eating or drinking anything that triggers your symptoms.  Keep all follow-up visits as told by your health care provider. This is important. SEEK MEDICAL CARE IF:  Your pain is not controlled with medicine.  You have  a fever. SEEK IMMEDIATE MEDICAL CARE IF:  Your pain moves to another part of your abdomen or to your back.  You continue to have symptoms or you develop new symptoms even with treatment.   This information is not intended to replace advice given to you by your health care provider. Make sure you discuss any questions you have with your health care provider.   Document Released: 02/11/2005 Document Revised: 11/02/2014 Document Reviewed: 05/25/2014 Elsevier Interactive Patient Education [employer]

## 2015-10-20 NOTE — Progress Notes (Signed)
Patient tolerated full liquid without any c/o n/v/pain at this time.   Sim BoastHavy, RN

## 2015-10-20 NOTE — Progress Notes (Signed)
Pt request to have her PIV removed. Request completed.   Sim BoastHavy, RN

## 2015-10-20 NOTE — Discharge Summary (Signed)
Physician Discharge Summary  [facility] VHQ:[id number] DOB: 04-13-1975  PCP: No PCP Per Patient  Admit date: 10/16/2015 Discharge date: 10/20/2015  Admitted From: Home Disposition:  Home  Recommendations for Outpatient Follow-up:  1. PCP of choice in 1 week with repeat labs (CBC & CMP) 2. Dr. De Blanch Kinsinger, General Surgery in 2 weeks  Home Health: None Equipment/Devices: None    Discharge Condition: Improved and stable.  CODE STATUS: Full  Diet recommendation: Start with soft diet for a day or 2 and then gradually advance to regular consistency diet as tolerated.  Discharge Diagnoses:  Principal Problem:   Gallstone pancreatitis Active Problems:   Other acute pancreatitis   Transaminasemia   Depression   Tobacco abuse   Pancreatitis   Brief/Interim Summary: 40 year old female with a history of gastric bypass, anxiety, depression, polysubstance abuse previously on suboxone 10 years ago presented with one-day history of abdominal pain in epigastric and right upper quadrant area. She has some nausea without emesis. She denied any fevers, chills, chest pain, shortness breath, diarrhea. Right upper quadrant ultrasound on 10/16/2015 showed cholelithiasis without biliary ductal dilatation. The patient was started on intravenous antibiotics and intravenous fluids. General surgery was consulted to assist with management.  Assessment/Plan: Acute Biliary Pancreatitis - RUQ US--cholelithiasis without signs of cholecystitis - No clinical features to suggest complicating factors associated with pancreatitis i.e. necrosis in the absence of high fevers, leukocytosis or toxic looking patient. Had been started on empiric imipenem on admission. Now status post laparoscopic cholecystectomy which showed mild cholecystitis.  - Discussed with surgical team today who agree no further need for antibiotics and hence were discontinued prior to discharge. -lipase 1269 >750 >154 >18 .  -  Patient underwent laparoscopic cholecystectomy on 8/24. She tolerated full liquid diet with appropriate postoperative pain. General surgery has seen her and have cleared her for discharge with advice to start soft diet and gradually advance outpatient as tolerated. Discussed with Ms. Anita Spangle, PA-C with general surgery who recommends outpatient follow-up with them in 2 weeks and has discussed extensively with patient regarding postop care.  Acute cholecystitis - Patient underwent laparoscopic cholecystectomy on 8/24 which showed mildly inflamed gallbladder. Management per general surgery, as indicated above.  Transaminasemia -LFTs have nearly normalized -check hep B and C-negative -check HIV: Non-reactive  Anxiety/Depression -continue lexapro, trazodone  Polysubstance abuse history (opiates, benzo, cocaine, Etoh) -See note from psychiatry--Dr. Margo [facility] on 02/03/15 on Care Everywhere -"I have legitimate pain, and I don't want to be labeled as narcotic seeking" -pt became annoyed when Dr. Arbutus [facility] addressed her previous history of substance abuse--"where did you get that information?" -pain appeared to be out of proportion with exam and objective findings.  - Patient has been provided a very short supply of opioid pain medication to deal with appropriate postop acute pain. - Patient was concerned regarding her ability to return to [facility] while on opioid pain medications. Clinical social work was consulted preoperatively who discussed with [facility]. Patient had been in [facility] abuse treatment [facility] for the last 2 months. They reported that she has completed her treatment and is currently living on campus in supported living while attending IOP. Since they are unable to accept patient postop and will likely have narcotics prescribed for pain control, they will allow her to return home and then readmitted to [facility] where she will go to the medical unit for  observation overnight. After that patient will return to her supported living on campus. As per  current plans, patient plans to stay with a friend after discharge from the hospital and return to [facility] on Monday. - Pregnancy test negative.  Tobacco abuse -cessation discussed  Hypokalemia - Replaced  Anemia - Stable.   Discharge Instructions  Discharge Instructions    Call MD for:  difficulty breathing, headache or visual disturbances    Complete by:  As directed   Call MD for:  extreme fatigue    Complete by:  As directed   Call MD for:  persistant dizziness or light-headedness    Complete by:  As directed   Call MD for:  persistant nausea and vomiting    Complete by:  As directed   Call MD for:  redness, tenderness, or signs of infection (pain, swelling, redness, odor or green/yellow discharge around incision site)    Complete by:  As directed   Call MD for:  severe uncontrolled pain    Complete by:  As directed   Call MD for:  temperature >100.4    Complete by:  As directed   Discharge instructions    Complete by:  As directed   Diet: Start with soft diet for a day or 2 and then gradually advance to regular consistency diet as tolerated.   Increase activity slowly    Complete by:  As directed       Medication List    TAKE these medications   escitalopram 20 MG tablet Commonly known as:  LEXAPRO Take 20 mg by mouth daily.   gabapentin 300 MG capsule Commonly known as:  NEURONTIN Take 300 mg by mouth 3 (three) times daily.   HYDROcodone-acetaminophen 5-325 MG tablet Commonly known as:  NORCO/VICODIN Take 1-2 tablets by mouth every 6 (six) hours as needed for moderate pain.   methocarbamol 500 MG tablet Commonly known as:  ROBAXIN Take 1 tablet (500 mg total) by mouth every 8 (eight) hours as needed for muscle spasms.   multivitamin with minerals Tabs tablet Take 1 tablet by mouth daily.   progesterone 100 MG capsule Commonly known as:  PROMETRIUM Take  100 mg by mouth daily.   traZODone 100 MG tablet Commonly known as:  DESYREL Take 200 mg by mouth at bedtime.      Follow-up Information    Family Doctor of choice. Schedule an appointment as soon as possible for a visit in 1 week(s).   Why:  To be seen with repeat labs (CBC & CMP).       Rodman Pickle, MD. Schedule an appointment as soon as possible for a visit in 2 week(s).   Specialty:  General Surgery Contact information: [address] [place] [address] [phone]          Allergies  Allergen Reactions  . Levaquin [Levofloxacin In D5w] Rash    During childhood. Pt unsure of details.  . Penicillins Rash    During childhood. Pt unsure of details.   Consultants - General surgery  Procedures/Studies: Dg Cholangiogram Operative  Result Date: 10/19/2015 CLINICAL DATA:  Gallstones EXAM: INTRAOPERATIVE CHOLANGIOGRAM TECHNIQUE: Cholangiographic images from the C-arm fluoroscopic device were submitted for interpretation post-operatively. Please see the procedural report for the amount of contrast and the fluoroscopy time utilized. COMPARISON:  None. FINDINGS: Contrast fills the biliary tree and duodenum without filling defects in the common bile duct. IMPRESSION: Patent biliary tree. Electronically Signed   By: Jolaine Click M.D.   On: 10/19/2015 16:56   Mr Abdomen Mrcp Wo Cm  Result  Date: 10/17/2015 CLINICAL DATA:  Gallstones and biliary dilatation. EXAM: MRI ABDOMEN WITHOUT CONTRAST  (INCLUDING MRCP) TECHNIQUE: Multiplanar multisequence MR imaging of the abdomen was performed. Heavily T2-weighted images of the biliary and pancreatic ducts were obtained, and three-dimensional MRCP images were rendered by post processing. COMPARISON:  10/17/2015 FINDINGS: Lower chest:  No acute findings. Hepatobiliary: No mass visualized on this unenhanced exam. Small stones within the gallbladder measuring up to 9 mm. No gallbladder wall thickening. The common bile duct  measures up to 5 mm, image 27 of series 4. Mild intrahepatic biliary prominence. No choledocholithiasis identified. Pancreas: No mass or inflammatory process visualized on this unenhanced exam. Spleen:  Within normal limits in size. Adrenals/Urinary Tract: No adrenal mass identified. No evidence of renal mass or hydronephrosis. Stomach/Bowel:  Unremarkable. Vascular/Lymphatic: No pathologically enlarged lymph nodes identified. No evidence of abdominal aortic aneurysm. Other:  None. Musculoskeletal:  No suspicious bone lesions identified. IMPRESSION: 1. Gallstones. 2. No significant bile duct dilatation and no evidence for choledocholithiasis. Electronically Signed   By: Signa Kellaylor  Stroud M.D.   On: 10/17/2015 12:54   [place] Abdomen Limited Ruq  Result Date: 10/17/2015 CLINICAL DATA:  Right upper quadrant abdominal pain EXAM: US ABDOMEN LIMITED - RIGHT UPPER QUADRANT COMPARISON:  None. FINDINGS: Gallbladder: Multiple shadowing gallstones. Gallbladder reportedly tender but there is partial distention and no wall thickening or pericholecystic edema Common bile duct: Diameter: 3 mm.  Where visualized, no filling defect. Liver: No focal lesion identified. Within normal limits in parenchymal echogenicity. IMPRESSION: Cholelithiasis. The gallbladder is tender but there is no distension or wall inflammation to suggest acute cholecystitis. Electronically Signed   By: Marnee SpringJonathon  Watts M.D.   On: 10/17/2015 01:30  Laparoscopic cholecystectomy on 8/24.    Subjective: States that she feels "okay". Tolerated full liquid diet. Flatus +. No BM. States some pain at laparoscopic site. As per RN, no acute issues.  Discharge Exam:  Vitals:   10/20/15 0101 10/20/15 0526 10/20/15 0900 10/20/15 1348  BP: 124/74 139/85 (!) 146/89 99/69  Pulse: 63 (!) 58 60 66  Resp: 16 16 18 16   Temp: 98.8 F (37.1 C) 98 F (36.7 C) 98.6 F (37 C) 98.6 F (37 C)  TempSrc: Oral Oral Oral Oral  SpO2: 96% 99% 98% 99%  Weight:      Height:        Patient examined with her female RN in room.  General:  Pleasant young female seen ambulating comfortably in the room without any distress.  HEENT: No icterus, No thrush, No neck mass, [place]/AT  Cardiovascular: RRR, S1/S2, no rubs, no gallops  Respiratory: CTA bilaterally, no wheezing, no crackles, no rhonchi  Abdomen:  abdomen is nondistended, soft, mild appropriate tenderness at laparoscopy site without rigidity, guarding or rebound. Normal bowel sounds heard.  Extremities: No edema, No lymphangitis, No petechiae, No rashes, no synovitis  Psychiatric: Pleasant and appropriate.    The results of significant diagnostics from this hospitalization (including imaging, microbiology, ancillary and laboratory) are listed below for reference.     Microbiology: No results found for this or any previous visit (from the past 240 hour(s)).   Labs: BNP (last 3 results) No results for input(s): BNP in the last 8760 hours. Basic Metabolic Panel:  Recent Labs Lab 10/16/15 2001 10/17/15 0655 10/18/15 0537 10/19/15 0333  NA 132* 136 138 138  K 3.4* 3.3* 3.6 3.8  CL 100* 104 107 107  CO2 26 25 21* 26  GLUCOSE 106* 112* 72 139*  BUN 9  9 7 8   CREATININE 0.71 0.62 0.66 0.72  CALCIUM 9.1 8.8* 9.0 9.0   Liver Function Tests:  Recent Labs Lab 10/16/15 2001 10/17/15 0655 10/18/15 0537 10/19/15 0333  AST 224* 102* 37 22  ALT 320* 203* 127* 89*  ALKPHOS 130* 106 103 97  BILITOT 1.1 0.7 0.9 0.3  PROT 7.3 5.9* 6.0* 6.0*  ALBUMIN 4.3 3.5 3.3* 3.1*    Recent Labs Lab 10/16/15 2001 10/17/15 0655 10/19/15 0333 10/20/15 0913  LIPASE 1,269* 750* 154* 18   No results for input(s): AMMONIA in the last 168 hours. CBC:  Recent Labs Lab 10/16/15 2001 10/17/15 0655 10/19/15 0333  WBC 11.1* 8.0 8.4  HGB 13.4 11.3* 11.8*  HCT 40.7 35.2* 36.4  MCV 98.1 98.1 97.8  PLT 213 155 170   Cardiac Enzymes: No results for input(s): CKTOTAL, CKMB, CKMBINDEX, TROPONINI in the last 168  hours. BNP: Invalid input(s): POCBNP CBG: No results for input(s): GLUCAP in the last 168 hours. D-Dimer No results for input(s): DDIMER in the last 72 hours. Hgb A1c No results for input(s): HGBA1C in the last 72 hours. Lipid Profile No results for input(s): CHOL, HDL, LDLCALC, TRIG, CHOLHDL, LDLDIRECT in the last 72 hours. Thyroid function studies No results for input(s): TSH, T4TOTAL, T3FREE, THYROIDAB in the last 72 hours.  Invalid input(s): FREET3 Anemia work up No results for input(s): VITAMINB12, FOLATE, FERRITIN, TIBC, IRON, RETICCTPCT in the last 72 hours. Urinalysis    Component Value Date/Time   COLORURINE YELLOW 10/16/2015 2121   APPEARANCEUR CLOUDY (A) 10/16/2015 2121   LABSPEC 1.016 10/16/2015 2121   PHURINE 7.0 10/16/2015 2121   GLUCOSEU NEGATIVE 10/16/2015 2121   HGBUR NEGATIVE 10/16/2015 2121   BILIRUBINUR NEGATIVE 10/16/2015 2121   KETONESUR 15 (A) 10/16/2015 2121   PROTEINUR NEGATIVE 10/16/2015 2121   UROBILINOGEN 2.0 (H) 10/16/2015 1853   NITRITE NEGATIVE 10/16/2015 2121   LEUKOCYTESUR NEGATIVE 10/16/2015 2121   Sepsis Labs Invalid input(s): PROCALCITONIN,  WBC,  LACTICIDVEN    Time coordinating discharge: Over 30 minutes  SIGNED:  Marcellus Scott, MD, FACP, FHM. [facility] Pager [phone]  If 7PM-7AM, please contact night-coverage [email or web address] Password [facility] 10/20/2015, 3:54 PM

## 2016-09-30 IMAGING — MR MR MRCP
7 of 11 series · 23 of 48 positions shown · non-contrast
Comparison: 10/17/2015

CLINICAL DATA: Gallstones and biliary dilatation.

EXAM:
MRI ABDOMEN WITHOUT CONTRAST  (INCLUDING MRCP)
TECHNIQUE: Multiplanar multisequence MR imaging of the abdomen was performed.
Heavily T2-weighted images of the biliary and pancreatic ducts were
obtained, and three-dimensional MRCP images were rendered by post
processing.

[Series 4: T2 · axial · 5.0mm · 0.78mm/px · z∈[-123,+92]mm · 3 of 44 slices shown (1 of 2)]
[im 1/44]
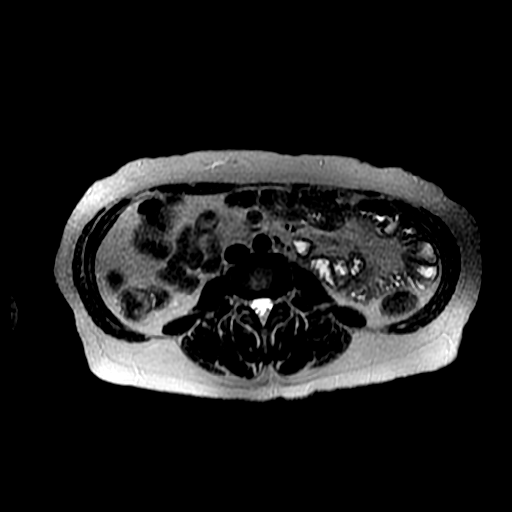
[im 22/44]
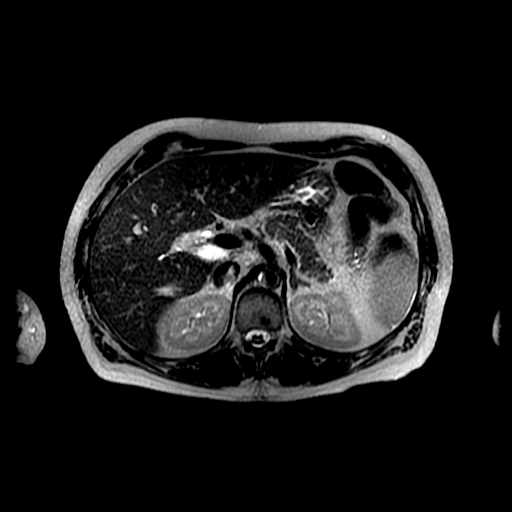
[im 44/44]
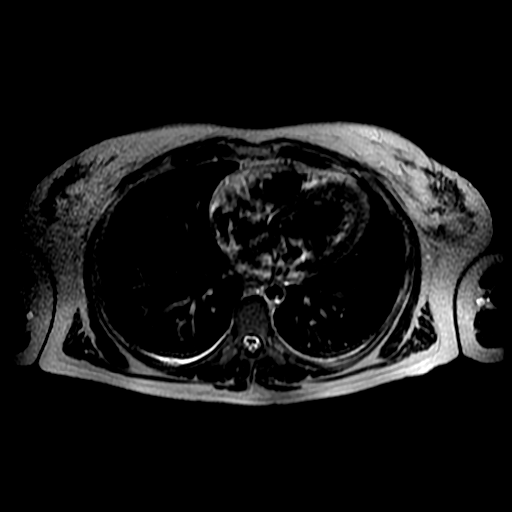

[Series 6: T2 · coronal · 5.0mm · 0.78mm/px · 3 of 36 slices shown (2 of 2)]
[im 1/36]
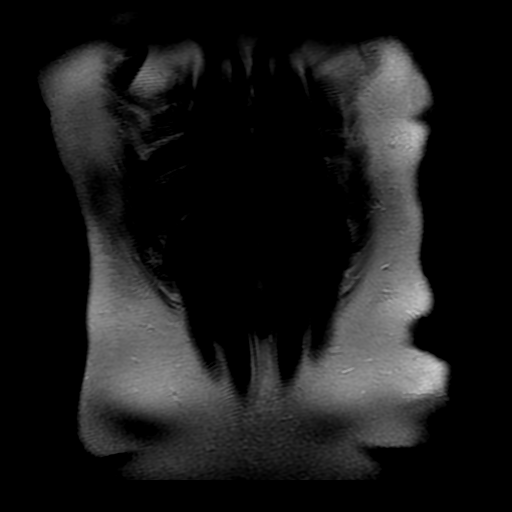
[im 18/36]
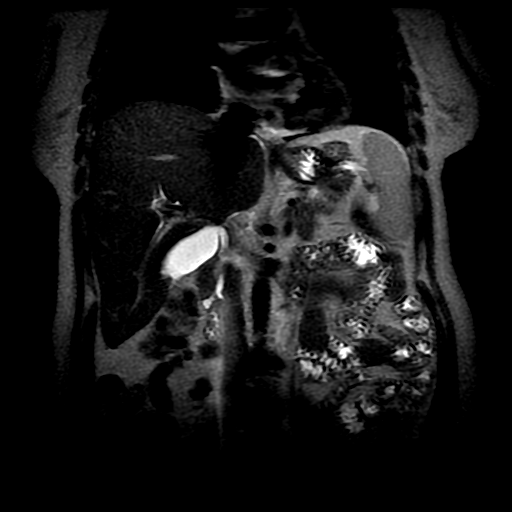
[im 36/36]
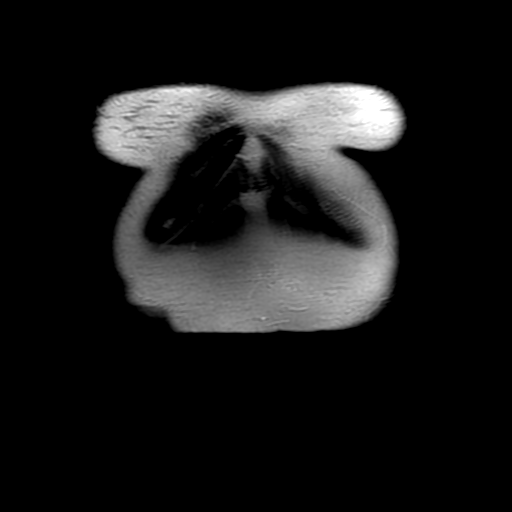

[Series 7: T2 fat-sat · axial · 5.0mm · 0.78mm/px · z∈[-125,+85]mm · 4 of 43 slices shown]
[im 1/43]
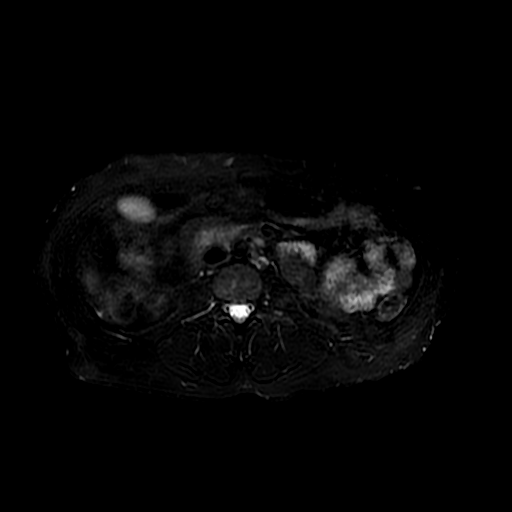
[im 15/43]
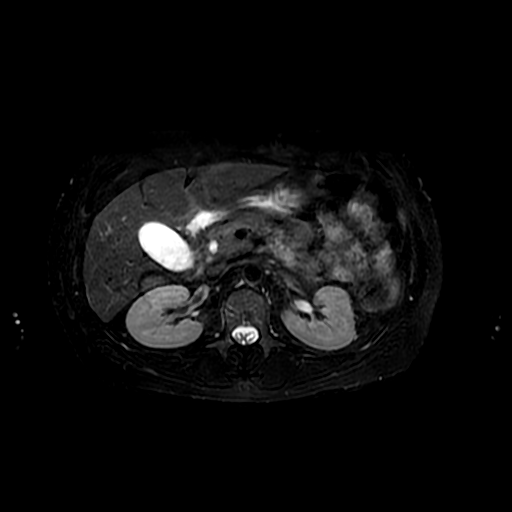
[im 29/43]
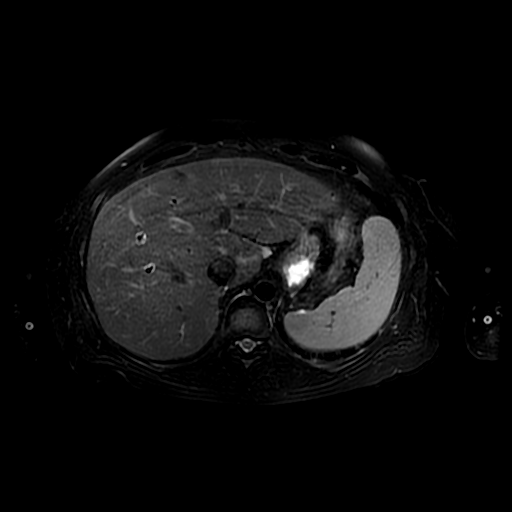
[im 43/43]
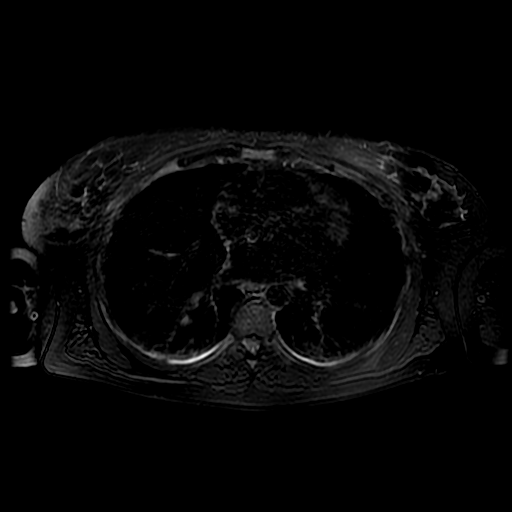

[Series 8: DWI b500 · axial · 6.0mm · 1.48mm/px · z∈[-132,+94]mm · 6 of 60 slices shown]
[im 1/60]
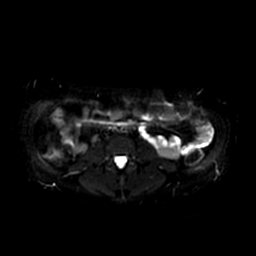
[im 12/60]
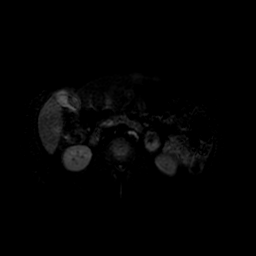
[im 24/60]
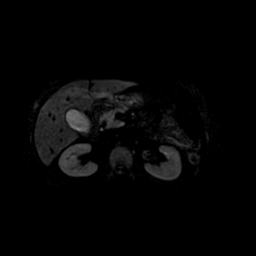
[im 36/60]
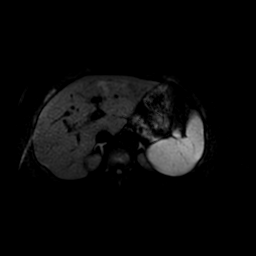
[im 48/60]
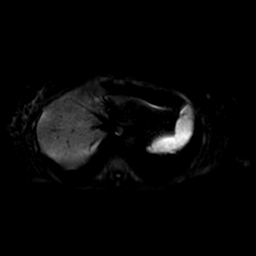
[im 60/60]
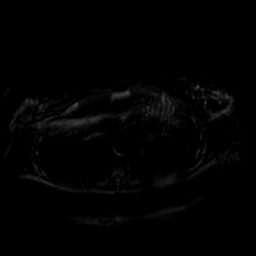

[Series 9: MRCP · coronal · 2.0mm · 0.70mm/px · 3 of 27 slices shown (1 of 2)]
[im 1/27]
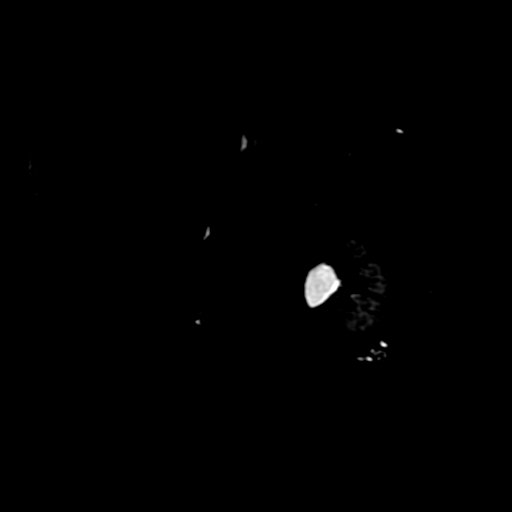
[im 14/27]
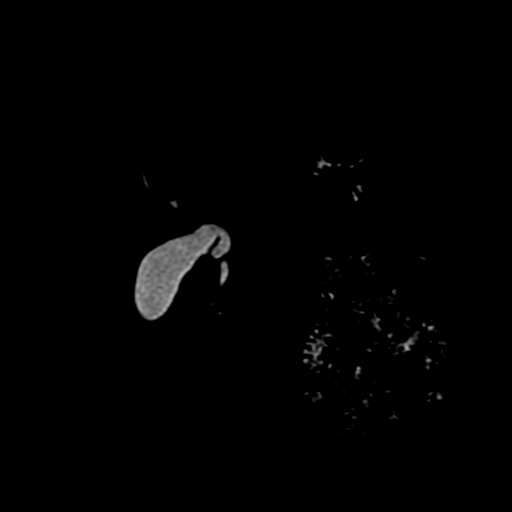
[im 27/27]
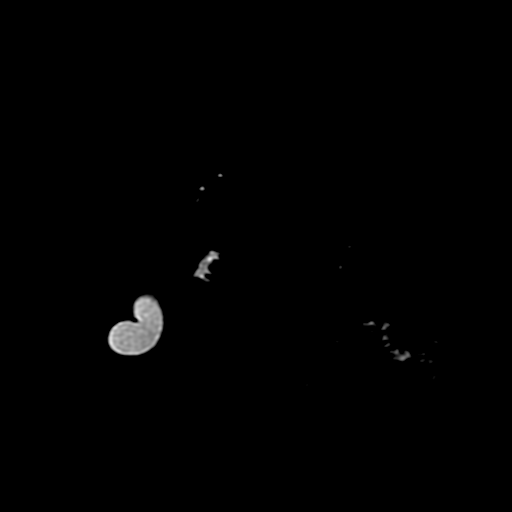

[Series 12: MRCP · coronal · 40.0mm · 0.70mm/px · 1 of 6 slices shown (2 of 2)]
[im 1/6]
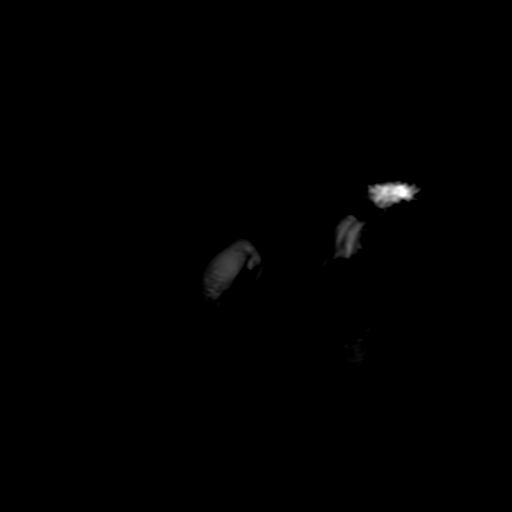

[Series 13: ax dualecho · axial · 5.0mm · 0.78mm/px · z∈[-146,-86]mm · 3 of 90 slices shown]
[im 1/90]
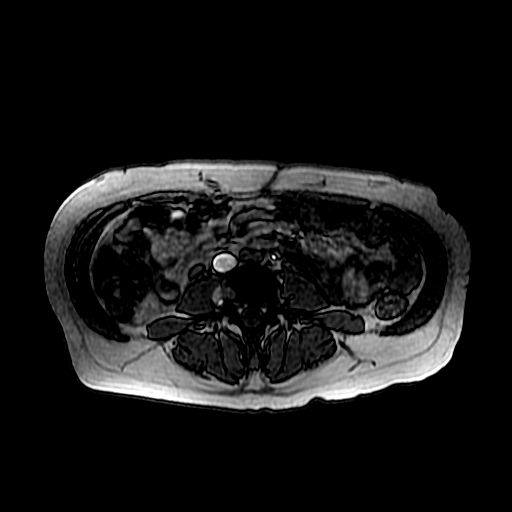
[im 13/90]
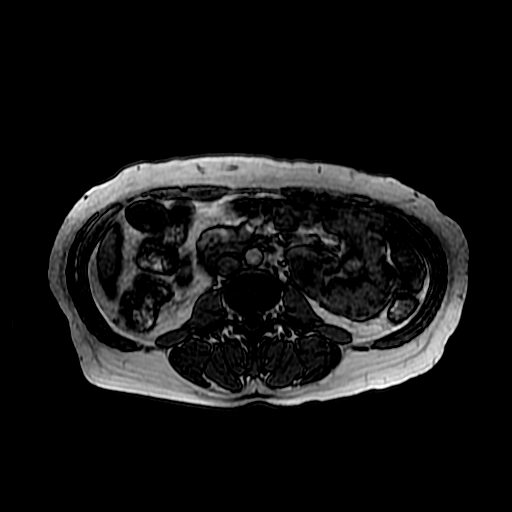
[im 26/90]
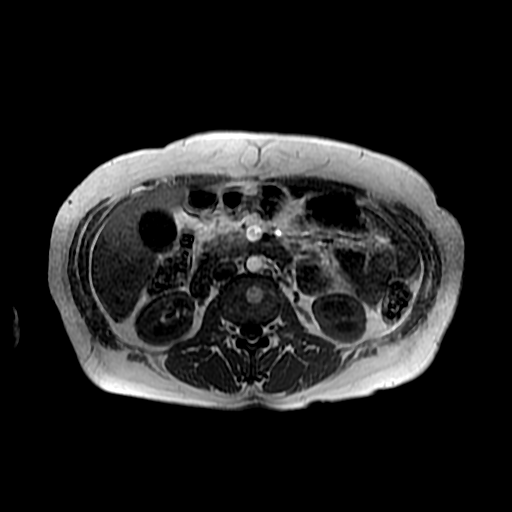

[23 of 48 positions shown; findings below may reference images not displayed]

FINDINGS: Lower chest:  No acute findings.

Hepatobiliary: No mass visualized on this unenhanced exam. Small
stones within the gallbladder measuring up to 9 mm. No gallbladder
wall thickening. The common bile duct measures up to 5 mm, image 27
of series 4. Mild intrahepatic biliary prominence. No
choledocholithiasis identified.

Pancreas: No mass or inflammatory process visualized on this
unenhanced exam.

Spleen:  Within normal limits in size.

Adrenals/Urinary Tract: No adrenal mass identified. No evidence of
renal mass or hydronephrosis.

Stomach/Bowel:  Unremarkable.

Vascular/Lymphatic: No pathologically enlarged lymph nodes
identified. No evidence of abdominal aortic aneurysm.

Other:  None.

Musculoskeletal:  No suspicious bone lesions identified.
IMPRESSION: 1. Gallstones.
2. No significant bile duct dilatation and no evidence for
choledocholithiasis.

## 2017-03-12 ENCOUNTER — Encounter (HOSPITAL_COMMUNITY): Payer: Self-pay | Admitting: Emergency Medicine

## 2017-03-12 ENCOUNTER — Emergency Department (HOSPITAL_COMMUNITY): Payer: BLUE CROSS/BLUE SHIELD

## 2017-03-12 ENCOUNTER — Emergency Department (HOSPITAL_COMMUNITY)
Admission: EM | Admit: 2017-03-12 | Discharge: 2017-03-12 | Disposition: A | Discharge status: Home / Self Care | Payer: BLUE CROSS/BLUE SHIELD | Attending: Emergency Medicine | Admitting: Emergency Medicine

## 2017-03-12 DIAGNOSIS — Y929 Unspecified place or not applicable: Secondary | ICD-10-CM | POA: Diagnosis not present

## 2017-03-12 DIAGNOSIS — Y939 Activity, unspecified: Secondary | ICD-10-CM | POA: Insufficient documentation

## 2017-03-12 DIAGNOSIS — S20219A Contusion of unspecified front wall of thorax, initial encounter: Secondary | ICD-10-CM

## 2017-03-12 DIAGNOSIS — S29019A Strain of muscle and tendon of unspecified wall of thorax, initial encounter: Secondary | ICD-10-CM

## 2017-03-12 DIAGNOSIS — Y999 Unspecified external cause status: Secondary | ICD-10-CM | POA: Diagnosis not present

## 2017-03-12 DIAGNOSIS — F17210 Nicotine dependence, cigarettes, uncomplicated: Secondary | ICD-10-CM | POA: Insufficient documentation

## 2017-03-12 DIAGNOSIS — Z041 Encounter for examination and observation following transport accident: Secondary | ICD-10-CM | POA: Diagnosis present

## 2017-03-12 MED ORDER — ACETAMINOPHEN 325 MG PO TABS
650.0000 mg | ORAL_TABLET | Freq: Once | ORAL | Status: AC
  Administered 2017-03-12: 650 mg via ORAL
  Filled 2017-03-12: qty 2

## 2017-03-12 NOTE — ED Notes (Signed)
No answer from lobby  
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
## 2017-06-10 ENCOUNTER — Ambulatory Visit (INDEPENDENT_AMBULATORY_CARE_PROVIDER_SITE_OTHER): Payer: BLUE CROSS/BLUE SHIELD | Admitting: Psychology

## 2017-06-10 ENCOUNTER — Encounter (HOSPITAL_COMMUNITY): Payer: Self-pay | Admitting: Psychology

## 2017-06-10 DIAGNOSIS — F1020 Alcohol dependence, uncomplicated: Secondary | ICD-10-CM | POA: Diagnosis not present

## 2017-06-10 DIAGNOSIS — F1120 Opioid dependence, uncomplicated: Secondary | ICD-10-CM | POA: Diagnosis not present
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
## 2017-06-15 ENCOUNTER — Ambulatory Visit (HOSPITAL_COMMUNITY)
Admission: EM | Admit: 2017-06-15 | Discharge: 2017-06-15 | Disposition: A | Discharge status: Home / Self Care | Payer: BLUE CROSS/BLUE SHIELD | Attending: Physician Assistant | Admitting: Physician Assistant

## 2017-06-15 ENCOUNTER — Encounter (HOSPITAL_COMMUNITY): Payer: Self-pay

## 2017-06-15 ENCOUNTER — Other Ambulatory Visit: Payer: Self-pay

## 2017-06-15 DIAGNOSIS — F411 Generalized anxiety disorder: Secondary | ICD-10-CM

## 2017-06-15 DIAGNOSIS — Z91199 Patient's noncompliance with other medical treatment and regimen due to unspecified reason: Secondary | ICD-10-CM

## 2017-06-15 DIAGNOSIS — F410 Panic disorder [episodic paroxysmal anxiety] without agoraphobia: Secondary | ICD-10-CM

## 2017-06-15 DIAGNOSIS — Z9119 Patient's noncompliance with other medical treatment and regimen: Secondary | ICD-10-CM

## 2017-06-15 MED ORDER — ESCITALOPRAM OXALATE 5 MG PO TABS
5.0000 mg | ORAL_TABLET | Freq: Every day | ORAL | 0 refills | Status: AC
Start: 2017-06-15 — End: ?

## 2017-06-15 MED ORDER — BUSPIRONE HCL 5 MG PO TABS: 5.0000 mg | ORAL_TABLET | Freq: Three times a day (TID) | ORAL | 0 refills | Status: AC

## 2017-06-15 NOTE — ED Notes (Signed)
Pt refusing EKG and lab draw

## 2017-06-15 NOTE — ED Triage Notes (Signed)
[facility] panic attack started today, pt states she suffers from severe anxiety, pt not sure what triggered panic attack today

## 2017-06-15 NOTE — ED Provider Notes (Signed)
06/15/2017 6:36 PM   DOB: [birth year] / MRN: [id number]  SUBJECTIVE:  Anita Harrington is a 42 y.o. female presenting for heart racing, feeling claustrophobic, feeling fidgety.  Patient has a long history of same.  Nausea is a history of alcohol abuse and tells me that she has not had anything to drink now for about 6 months.  She does take trazodone for sleep at this time.  Her husband is with her today.  She is allergic to amoxicillin; cefaclor; ciprofloxacin hcl; levaquin [levofloxacin in d5w]; and penicillins.   She  has a past medical history of Anxiety.    She  reports that she has been smoking.  She has a 25.00 pack-year smoking history. She has never used smokeless tobacco. She reports that she does not drink alcohol or use drugs. She  reports that she currently engages in sexual activity. The patient  has a past surgical history that includes Gastric bypass; Ovarian cyst removal; and Cholecystectomy (N/A, 10/19/2015).  Her family history includes Diabetes in her father and mother.  Review of Systems  Constitutional: Negative for chills, diaphoresis and fever.  Respiratory: Negative for cough, hemoptysis, sputum production, shortness of breath and wheezing.   Cardiovascular: Negative for chest pain, orthopnea and leg swelling.  Gastrointestinal: Negative for nausea.  Skin: Negative for rash.  Neurological: Negative for dizziness.    OBJECTIVE:  BP (!) 140/92 (BP Location: Left Arm)   Pulse (!) 108   Temp 98.2 F (36.8 C) (Oral)   Resp 18   LMP 06/09/2017 (Exact Date)   SpO2 100%   Lab Results  Component Value Date   ALT 89 (H) 10/19/2015   AST 22 10/19/2015   ALKPHOS 97 10/19/2015   BILITOT 0.3 10/19/2015   Physical Exam  Cardiovascular: Regular rhythm, normal heart sounds and intact distal pulses. Exam reveals no gallop and no friction rub.  No murmur heard. Pulmonary/Chest: Effort normal and breath sounds normal. No stridor. No respiratory distress. She has no wheezes.  She has no rales. She exhibits no tenderness.  Musculoskeletal: Normal range of motion.  Psychiatric: Her mood appears anxious. Her affect is not angry, not blunt, not labile and not inappropriate. Her speech is not rapid and/or pressured, not delayed, not tangential and not slurred. She is hyperactive. She is not agitated, not aggressive, not slowed, not withdrawn and not combative. Thought content is not paranoid and not delusional. Cognition and memory are not impaired. She does not express impulsivity or inappropriate judgment. She does not exhibit a depressed mood. She expresses no homicidal and no suicidal ideation. She expresses no suicidal plans and no homicidal plans. She is communicative. She exhibits normal recent memory and normal remote memory.    No results found for this or any previous visit (from the past 72 hour(s)).  No results found.  ASSESSMENT AND PLAN:   Generalized anxiety disorder with panic attacks - I am informed by nursing that she is refusing blood draw and also refusing EKG. when I go back into the room to discuss with wife's important to get a EKG today her husband remains in the room and the patient is said to be out back smoking a cigarette.  I did advise the husband that I cannot adequately assess her symptoms without an EKG and advised that the risk could include death.  He states that she understands this and "really just needs something for the anxiety."  Her exam is negative for swelling about the bilateral legs.  She has not  tachypneic on exam.  Heart rate mildly tachycardic however regular.  Patient is requesting Xanax.  I refused this.  She has been on Lexapro in the past and tells me that this was helpful for her.  I will start this back at 5 mg.  Prescribing BuSpar along with this.      The patient is advised to call or return to clinic if she does not see an improvement in symptoms, or to seek the care of the closest emergency department if she worsens  with the above plan.   Deliah Boston, MHS, PA-C 06/15/2017 6:36 PM    Ofilia Neas, PA-C 06/15/17 [id number]

## 2017-06-16 ENCOUNTER — Telehealth (HOSPITAL_COMMUNITY): Payer: Self-pay | Admitting: Psychology

## 2017-06-26 ENCOUNTER — Telehealth (HOSPITAL_COMMUNITY): Payer: Self-pay | Admitting: Psychology

## 2017-10-16 ENCOUNTER — Emergency Department (HOSPITAL_COMMUNITY)
Admission: EM | Admit: 2017-10-16 | Discharge: 2017-10-16 | Discharge status: Against Medical Advice | Payer: BLUE CROSS/BLUE SHIELD | Attending: Emergency Medicine | Admitting: Emergency Medicine

## 2017-10-16 ENCOUNTER — Other Ambulatory Visit: Payer: Self-pay

## 2017-10-16 ENCOUNTER — Ambulatory Visit (HOSPITAL_COMMUNITY): Payer: BLUE CROSS/BLUE SHIELD

## 2017-10-16 DIAGNOSIS — Z5329 Procedure and treatment not carried out because of patient's decision for other reasons: Secondary | ICD-10-CM

## 2017-10-16 DIAGNOSIS — Z5320 Procedure and treatment not carried out because of patient's decision for unspecified reasons: Secondary | ICD-10-CM

## 2017-10-16 DIAGNOSIS — R1084 Generalized abdominal pain: Secondary | ICD-10-CM | POA: Diagnosis present

## 2017-10-16 DIAGNOSIS — Z5321 Procedure and treatment not carried out due to patient leaving prior to being seen by health care provider: Secondary | ICD-10-CM | POA: Insufficient documentation

## 2017-10-16 DIAGNOSIS — Z9884 Bariatric surgery status: Secondary | ICD-10-CM | POA: Diagnosis not present

## 2017-10-16 DIAGNOSIS — F17210 Nicotine dependence, cigarettes, uncomplicated: Secondary | ICD-10-CM | POA: Insufficient documentation

## 2017-10-16 DIAGNOSIS — R112 Nausea with vomiting, unspecified: Secondary | ICD-10-CM | POA: Diagnosis not present

## 2017-10-16 LAB — URINALYSIS, ROUTINE W REFLEX MICROSCOPIC
Bilirubin Urine: NEGATIVE
Glucose, UA: NEGATIVE mg/dL
Hgb urine dipstick: NEGATIVE
Ketones, ur: NEGATIVE mg/dL
LEUKOCYTES UA: NEGATIVE
NITRITE: NEGATIVE
PH: 7 (ref 5.0–8.0)
Protein, ur: NEGATIVE mg/dL
Specific Gravity, Urine: 1.012 (ref 1.005–1.030)

## 2017-10-16 LAB — CBC WITH DIFFERENTIAL/PLATELET
BASOS ABS: 0 10*3/uL (ref 0.0–0.1)
Basophils Relative: 0 %
Eosinophils Absolute: 0.2 10*3/uL (ref 0.0–0.7)
Eosinophils Relative: 3 %
HCT: 37.9 % (ref 36.0–46.0)
Hemoglobin: 12.5 g/dL (ref 12.0–15.0)
LYMPHS PCT: 36 %
Lymphs Abs: 2.2 10*3/uL (ref 0.7–4.0)
MCH: 29.6 pg (ref 26.0–34.0)
MCHC: 33 g/dL (ref 30.0–36.0)
MCV: 89.8 fL (ref 78.0–100.0)
Monocytes Absolute: 0.4 10*3/uL (ref 0.1–1.0)
Monocytes Relative: 7 %
Neutro Abs: 3.3 10*3/uL (ref 1.7–7.7)
Neutrophils Relative %: 54 %
Platelets: 184 10*3/uL (ref 150–400)
RBC: 4.22 MIL/uL (ref 3.87–5.11)
RDW: 15.8 % — ABNORMAL HIGH (ref 11.5–15.5)
WBC: 6.1 10*3/uL (ref 4.0–10.5)

## 2017-10-16 LAB — COMPREHENSIVE METABOLIC PANEL
ALBUMIN: 3.6 g/dL (ref 3.5–5.0)
ALT: 18 U/L (ref 0–44)
ANION GAP: 8 (ref 5–15)
AST: 29 U/L (ref 15–41)
Alkaline Phosphatase: 77 U/L (ref 38–126)
BILIRUBIN TOTAL: 0.6 mg/dL (ref 0.3–1.2)
BUN: 9 mg/dL (ref 6–20)
CHLORIDE: 99 mmol/L (ref 98–111)
CO2: 27 mmol/L (ref 22–32)
Calcium: 8.3 mg/dL — ABNORMAL LOW (ref 8.9–10.3)
Creatinine, Ser: 0.62 mg/dL (ref 0.44–1.00)
GFR calc Af Amer: >60 mL/min (ref >60)
GFR calc non Af Amer: >60 mL/min (ref >60)
GLUCOSE: 95 mg/dL (ref 70–99)
POTASSIUM: 4 mmol/L (ref 3.5–5.1)
Sodium: 134 mmol/L — ABNORMAL LOW (ref 135–145)
TOTAL PROTEIN: 6.1 g/dL — AB (ref 6.5–8.1)

## 2017-10-16 LAB — I-STAT TROPONIN, ED: TROPONIN I, POC: 0 ng/mL (ref 0.00–0.08)

## 2017-10-16 LAB — ETHANOL: Alcohol, Ethyl (B): <10 mg/dL (ref <10)

## 2017-10-16 LAB — I-STAT BETA HCG BLOOD, ED (MC, WL, AP ONLY): I-stat hCG, quantitative: <5 m[IU]/mL (ref <5)

## 2017-10-16 LAB — LIPASE, BLOOD: Lipase: 26 U/L (ref 11–51)

## 2017-10-16 MED ORDER — DICYCLOMINE HCL 10 MG PO CAPS: 20.0000 mg | ORAL_CAPSULE | Freq: Once | ORAL | Status: DC

## 2017-10-16 MED ORDER — FENTANYL CITRATE (PF) 100 MCG/2ML IJ SOLN
50.0000 ug | Freq: Once | INTRAMUSCULAR | Status: AC
  Administered 2017-10-16: 50 ug via INTRAVENOUS
  Filled 2017-10-16: qty 2

## 2017-10-16 MED ORDER — IOHEXOL 300 MG/ML  SOLN
30.0000 mL | Freq: Once | INTRAMUSCULAR | Status: AC | PRN
  Administered 2017-10-16: 30 mL via ORAL

## 2017-10-16 MED ORDER — GI COCKTAIL ~~LOC~~
30.0000 mL | Freq: Once | ORAL | Status: DC
  Filled 2017-10-16: qty 30

## 2017-10-16 MED ORDER — SODIUM CHLORIDE 0.9 % IV BOLUS
1000.0000 mL | Freq: Once | INTRAVENOUS | Status: AC
  Administered 2017-10-16: 1000 mL via INTRAVENOUS

## 2017-10-16 NOTE — ED Provider Notes (Signed)
[facility]-EMERGENCY DEPT Provider Note   CSN: [id number] Arrival date & time: 10/16/17  2952     History   Chief Complaint Chief Complaint  Patient presents with  . Abdominal Pain    HPI Anita Harrington is a 42 y.o. female with a past medical history of alcohol use disorder, pancreatitis, status post cholecystectomy and gastric bypass, anxiety, who presents today for evaluation of upper abdominal pain.  She reports that her pain started yesterday afternoon and is gotten progressively worse throughout the night.  She reports that the pain goes into her back.  She denies any numbness or tingling in her legs, no changes to bowel or bladder function.  She does note and occasional white vaginal discharge, however denies pelvic pain, states that she wishes for this to be evaluated as an outpatient.  Chart review shows that in April of this year she was seen for behavioral health and substance abuse.  At that time she reported that she was drinking about 12 beers daily, using marijuana sporadically, narcotics daily, including Kratom and heroin.    HPI  Past Medical History:  Diagnosis Date  . Anxiety     Patient Active Problem List   Diagnosis Date Noted  . Alcohol use disorder, severe, dependence (HCC) 06/10/2017  . Gallstone pancreatitis 10/17/2015  . Transaminasemia 10/17/2015  . Depression 10/17/2015  . Tobacco abuse 10/17/2015  . Pancreatitis 10/17/2015  . Other acute pancreatitis     Past Surgical History:  Procedure Laterality Date  . CHOLECYSTECTOMY N/A 10/19/2015   Procedure: LAPAROSCOPIC CHOLECYSTECTOMY with Intraoperative Cholangiogram;  Surgeon: Rodman Pickle, MD;  Location: [facility] OR;  Service: General;  Laterality: N/A;  . GASTRIC BYPASS    . OVARIAN CYST REMOVAL       OB History   None      Home Medications    Prior to Admission medications   Medication Sig Start Date End Date Taking? Authorizing Provider  ALPRAZolam Prudy Feeler)  0.5 MG tablet Take 0.5 mg by mouth 3 (three) times daily as needed for anxiety.   Yes [provider]  ARIPiprazole (ABILIFY) 10 MG tablet Take 10 mg by mouth daily.   Yes [provider]  gabapentin (NEURONTIN) 300 MG capsule Take 300 mg by mouth 3 (three) times daily.   Yes [provider]  lisdexamfetamine (VYVANSE) 30 MG capsule Take 30 mg by mouth daily.   Yes [provider]  traZODone (DESYREL) 150 MG tablet Take 75 mg by mouth at bedtime.   Yes [provider]  busPIRone (BUSPAR) 5 MG tablet Take 1 tablet (5 mg total) by mouth 3 (three) times daily. Need to be refilled by PCP or psychiatry. Patient not taking: Reported on 10/16/2017 06/15/17   Ofilia Neas, PA-C  escitalopram (LEXAPRO) 5 MG tablet Take 1 tablet (5 mg total) by mouth daily. Take with food.  Need to be refilled by PCP or psychiatry. Patient not taking: Reported on 10/16/2017 06/15/17   Ofilia Neas, PA-C    Family History Family History  Problem Relation Age of Onset  . Diabetes Mother   . Diabetes Father     Social History Social History   Tobacco Use  . Smoking status: Current Every Day Smoker    Packs/day: 1.00    Years: 25.00    Pack years: 25.00  . Smokeless tobacco: Never Used  Substance Use Topics  . Alcohol use: No  . Drug use: No     Allergies  Amoxicillin; Cefaclor; Ciprofloxacin hcl; Levaquin [levofloxacin in d5w]; and Penicillins   Review of Systems Review of Systems  Constitutional: Negative for chills and fever.  HENT: Negative for ear pain and sore throat.   Eyes: Negative for pain and visual disturbance.  Respiratory: Negative for cough and shortness of breath.   Cardiovascular: Negative for chest pain and palpitations.  Gastrointestinal: Positive for abdominal pain, nausea and vomiting.       Large bowel movements that cause rectal pain  Genitourinary: Positive for vaginal discharge (Occasionally, white. ). Negative for dysuria,  hematuria, urgency, vaginal bleeding and vaginal pain.  Musculoskeletal: Negative for arthralgias, back pain and neck pain.  Skin: Negative for color change and rash.  Neurological: Negative for seizures and syncope.  All other systems reviewed and are negative.    Physical Exam Updated Vital Signs BP (!) 161/94   Pulse 67   Temp 97.8 F (36.6 C) (Oral)   Resp 14   Ht 5' 1.5" (1.562 m)   Wt 54 kg   SpO2 100%   BMI 22.12 kg/m   Physical Exam  Constitutional: She appears well-developed and well-nourished.  Non-toxic appearance. No distress.  HENT:  Head: Normocephalic and atraumatic.  Mouth/Throat: Oropharynx is clear and moist.  Eyes: Conjunctivae are normal. Right eye exhibits no discharge. Left eye exhibits no discharge. No scleral icterus.  Neck: Normal range of motion.  Cardiovascular: Normal rate and regular rhythm.  Pulmonary/Chest: Effort normal. No stridor. No respiratory distress.  Abdominal: Normal appearance. She exhibits no distension. Bowel sounds are increased. There is tenderness in the epigastric area, periumbilical area and left upper quadrant.  Musculoskeletal: She exhibits no edema or deformity.  Neurological: She is alert. She exhibits normal muscle tone.  Skin: Skin is warm and dry. She is not diaphoretic.  Psychiatric: She has a normal mood and affect. Her behavior is normal.  Nursing note and vitals reviewed.    ED Treatments / Results  Labs (all labs ordered are listed, but only abnormal results are displayed) Labs Reviewed  CBC WITH DIFFERENTIAL/PLATELET - Abnormal; Notable for the following components:      Result Value   RDW 15.8 (*)    All other components within normal limits  COMPREHENSIVE METABOLIC PANEL - Abnormal; Notable for the following components:   Sodium 134 (*)    Calcium 8.3 (*)    Total Protein 6.1 (*)    All other components within normal limits  ETHANOL  URINALYSIS, ROUTINE W REFLEX MICROSCOPIC  LIPASE, BLOOD  I-STAT  BETA HCG BLOOD, ED ([facility], [facility], [facility] ONLY)  I-STAT TROPONIN, ED    EKG EKG Interpretation  Date/Time:  Thursday October 16 2017 08:01:50 EDT Ventricular Rate:  67 PR Interval:    QRS Duration: 99 QT Interval:  416 QTC Calculation: 440 R Axis:   75 Text Interpretation:  Sinus rhythm Probable left atrial enlargement No old tracing to compare Confirmed by Azalia Bilisampos, Kevin ([id number]) on 10/16/2017 8:31:53 AM   Radiology No results found.  Procedures Procedures (including critical care time)  Medications Ordered in ED Medications  sodium chloride 0.9 % bolus 1,000 mL (0 mLs Intravenous Stopped 10/16/17 1029)  iohexol (OMNIPAQUE) 300 MG/ML solution 30 mL (30 mLs Oral Contrast Given 10/16/17 0802)  fentaNYL (SUBLIMAZE) injection 50 mcg (50 mcg Intravenous Given 10/16/17 0844)     Initial Impression / Assessment and Plan / ED Course  I have reviewed the triage vital signs and the nursing notes.  Pertinent labs & imaging results that were  available during my care of the patient were reviewed by me and considered in my medical decision making (see chart for details).  Clinical Course as of Oct 17 1619  Thu Oct 16, 2017  1033 Informed by RN that patient has removed own Iv.  Does not wish for CT scan.  She is currently willing to wait for labs to come back.  Will monitor.    [EH]  1102 Patient labs came back, she was updated.  She reports that she went outside for 5 minutes to go smoke, however her pain got worse and she came back in.  Informed patient that she needs to stay inside in the department while she is here.  Patient had previously removed IV, however is now willing to get CT scan.  Nursing communication sent to reestablish IV.   [EH]  1106 As patient left the department "to smoke" will hold narcotic pain medication.  Given bentyl.    [EH]    Clinical Course User Index [EH] Cristina Gong, PA-C   Patient presents today for evaluation of abdominal pain.  Labs were obtained and  reviewed without significant abnormalities.  Patient left AMA prior to completion of CT scan.  While in the department she also left, despite being told that she should not, to "go smoke".  Patient left against medical advise.  She was informed that if she wishes to seek additional work-up she may return at any point.  Final Clinical Impressions(s) / ED Diagnoses   Final diagnoses:  Generalized abdominal pain  Left against medical advice    ED Discharge Orders    None       Norman Clay 10/16/17 1621    Azalia Bilis, MD 10/17/17 [phone]

## 2017-10-16 NOTE — ED Triage Notes (Signed)
Pt reports abd pain that began yesterday and has progressively gotten worse through the night

## 2017-10-16 NOTE — ED Notes (Signed)
Upon entering patient's room to start IV pt is getting dressed stating she wants to leave. PA made aware. Pt signed AMA form.

## 2017-11-03 ENCOUNTER — Other Ambulatory Visit (HOSPITAL_COMMUNITY): Payer: Self-pay | Admitting: Obstetrics and Gynecology

## 2017-11-03 DIAGNOSIS — Z3141 Encounter for fertility testing: Secondary | ICD-10-CM

## 2017-11-07 ENCOUNTER — Ambulatory Visit (HOSPITAL_COMMUNITY)
Admission: RE | Admit: 2017-11-07 | Discharge: 2017-11-07 | Disposition: A | Discharge status: Home / Self Care | Payer: BLUE CROSS/BLUE SHIELD | Source: Ambulatory Visit | Attending: Obstetrics and Gynecology | Admitting: Obstetrics and Gynecology

## 2017-11-07 DIAGNOSIS — Z3141 Encounter for fertility testing: Secondary | ICD-10-CM | POA: Diagnosis not present

## 2017-11-07 MED ORDER — IOPAMIDOL (ISOVUE-300) INJECTION 61%
30.0000 mL | Freq: Once | INTRAVENOUS | Status: AC | PRN
  Administered 2017-11-07: 4 mL

## 2019-05-13 ENCOUNTER — Ambulatory Visit: Payer: BC Managed Care – PPO | Attending: Internal Medicine

## 2019-05-13 DIAGNOSIS — Z23 Encounter for immunization: Secondary | ICD-10-CM

## 2019-05-13 NOTE — Progress Notes (Signed)
   Covid-19 Vaccination Clinic  Name:  Aaliyana Fredericks    MRN: [id number] DOB: 1975/09/10  05/13/2019  Ms. Milowsky was observed post Covid-19 immunization for 15 minutes without incident. She was provided with Vaccine Information Sheet and instruction to access the V-Safe system.   Ms. Max Fickle was instructed to call 911 with any severe reactions post vaccine: Marland Kitchen Difficulty breathing  . Swelling of face and throat  . A fast heartbeat  . A bad rash all over body  . Dizziness and weakness   Immunizations Administered    Name Date Dose VIS Date Route   Pfizer COVID-19 Vaccine 05/13/2019  2:13 PM 0.3 mL 02/05/2019 Intramuscular   Manufacturer: [employer], [employer]   Lot: [id number]   NDC: [address]-3

## 2019-06-07 ENCOUNTER — Ambulatory Visit: Payer: BC Managed Care – PPO | Attending: Internal Medicine

## 2019-06-07 DIAGNOSIS — Z23 Encounter for immunization: Secondary | ICD-10-CM

## 2019-06-07 NOTE — Progress Notes (Signed)
   Covid-19 Vaccination Clinic  Name:  Kambrie Eddleman    MRN: [id number] DOB: [birth year]  06/07/2019  Ms. Botkin was observed post Covid-19 immunization for 15 minutes without incident. She was provided with Vaccine Information Sheet and instruction to access the V-Safe system.   Ms. Balon was instructed to call 911 with any severe reactions post vaccine: Marland Kitchen Difficulty breathing  . Swelling of face and throat  . A fast heartbeat  . A bad rash all over body  . Dizziness and weakness   Immunizations Administered    Name Date Dose VIS Date Route   Pfizer COVID-19 Vaccine 06/07/2019  3:15 PM 0.3 mL 02/05/2019 Intramuscular   Manufacturer: [employer], [employer]   Lot: [id number]   NDC: [address]-1

## 2019-09-24 ENCOUNTER — Ambulatory Visit (HOSPITAL_COMMUNITY)
Admission: EM | Admit: 2019-09-24 | Discharge: 2019-09-24 | Disposition: A | Discharge status: Home / Self Care | Payer: BC Managed Care – PPO

## 2019-09-24 ENCOUNTER — Encounter (HOSPITAL_COMMUNITY): Payer: Self-pay

## 2019-09-24 DIAGNOSIS — Z23 Encounter for immunization: Secondary | ICD-10-CM

## 2019-09-24 DIAGNOSIS — S61213A Laceration without foreign body of left middle finger without damage to nail, initial encounter: Secondary | ICD-10-CM | POA: Diagnosis not present

## 2019-09-24 MED ORDER — TETANUS-DIPHTH-ACELL PERTUSSIS 5-2.5-18.5 LF-MCG/0.5 IM SUSP
0.5000 mL | Freq: Once | INTRAMUSCULAR | Status: AC
  Administered 2019-09-24: 0.5 mL via INTRAMUSCULAR

## 2019-09-24 MED ORDER — LIDOCAINE HCL 2 % IJ SOLN
INTRAMUSCULAR | Status: AC
  Filled 2019-09-24: qty 20

## 2019-09-24 MED ORDER — CLINDAMYCIN HCL 150 MG PO CAPS: 150.0000 mg | ORAL_CAPSULE | Freq: Three times a day (TID) | ORAL | 0 refills | Status: AC

## 2019-09-24 MED ORDER — TETANUS-DIPHTH-ACELL PERTUSSIS 5-2.5-18.5 LF-MCG/0.5 IM SUSP
INTRAMUSCULAR | Status: AC
  Filled 2019-09-24: qty 0.5

## 2019-09-24 NOTE — ED Triage Notes (Signed)
Patient is here today with a laceration to her left 2nd finger. Patient states she was a using a medallion to cut up some garlic last night when she cut her finger. Patient states she does not know whether or not her Tdap is up to date.

## 2019-09-24 NOTE — Discharge Instructions (Addendum)
Change bandage daily Clean with soap and water  Take medication as prescribed  Return on Sunday for re-evaluation

## 2019-09-24 NOTE — ED Provider Notes (Signed)
[facility]    CSN: [id number] Arrival date & time: 09/24/19  1519      History   Chief Complaint Chief Complaint  Patient presents with  . Laceration    HPI Anita Harrington is a 44 y.o. female.   Patient presents for left middle finger laceration. This occurred around 2300 hours last night. She reports she did this with a kitchen slicing device. Bleeding controlled with pressure. Reports some distal numbness. Unknown last tetanus.     Past Medical History:  Diagnosis Date  . Anxiety     Patient Active Problem List   Diagnosis Date Noted  . Alcohol use disorder, severe, dependence (HCC) 06/10/2017  . Gallstone pancreatitis 10/17/2015  . Transaminasemia 10/17/2015  . Depression 10/17/2015  . Tobacco abuse 10/17/2015  . Pancreatitis 10/17/2015  . Other acute pancreatitis     Past Surgical History:  Procedure Laterality Date  . CHOLECYSTECTOMY N/A 10/19/2015   Procedure: LAPAROSCOPIC CHOLECYSTECTOMY with Intraoperative Cholangiogram;  Surgeon: Rodman Pickle, MD;  Location: [facility] OR;  Service: General;  Laterality: N/A;  . GASTRIC BYPASS    . OVARIAN CYST REMOVAL      OB History   No obstetric history on file.      Home Medications    Prior to Admission medications   Medication Sig Start Date End Date Taking? Authorizing Provider  ALPRAZolam Prudy Feeler) 0.5 MG tablet Take 0.5 mg by mouth 3 (three) times daily as needed for anxiety.    [provider]  ARIPiprazole (ABILIFY) 10 MG tablet Take 10 mg by mouth daily.    [provider]  busPIRone (BUSPAR) 5 MG tablet Take 1 tablet (5 mg total) by mouth 3 (three) times daily. Need to be refilled by PCP or psychiatry. Patient not taking: Reported on 10/16/2017 06/15/17   Ofilia Neas, PA-C  chlordiazePOXIDE (LIBRIUM) 25 MG capsule Take 25 mg by mouth 2 (two) times daily. 08/31/19   [provider]  clindamycin (CLEOCIN) 150 MG capsule Take 1 capsule (150 mg total) by mouth 3 (three)  times daily for 5 days. 09/24/19 09/29/19  Kamron Portee, Veryl Speak, PA-C  cloNIDine (CATAPRES) 0.1 MG tablet Take 0.1 mg by mouth daily. 09/03/19   [provider]  desvenlafaxine (PRISTIQ) 100 MG 24 hr tablet Take 100 mg by mouth every morning. 09/16/19   [provider]  escitalopram (LEXAPRO) 5 MG tablet Take 1 tablet (5 mg total) by mouth daily. Take with food.  Need to be refilled by PCP or psychiatry. Patient not taking: Reported on 10/16/2017 06/15/17   Ofilia Neas, PA-C  gabapentin (NEURONTIN) 300 MG capsule Take 300 mg by mouth 3 (three) times daily.    [provider]  lisdexamfetamine (VYVANSE) 30 MG capsule Take 30 mg by mouth daily.    [provider]  REXULTI 2 MG TABS tablet Take 2 mg by mouth at bedtime. 09/12/19   [provider]  traZODone (DESYREL) 150 MG tablet Take 75 mg by mouth at bedtime.    [provider]    Family History Family History  Problem Relation Age of Onset  . Diabetes Mother   . Diabetes Father     Social History Social History   Tobacco Use  . Smoking status: Current Every Day Smoker    Packs/day: 1.00    Years: 25.00    Pack years: 25.00  . Smokeless tobacco: Never Used  Vaping Use  . Vaping Use: Never used  Substance Use Topics  .  Alcohol use: No  . Drug use: No     Allergies   Amoxicillin, Cefaclor, Ciprofloxacin hcl, Levaquin [levofloxacin in d5w], and Penicillins   Review of Systems Review of Systems   Physical Exam Triage Vital Signs ED Triage Vitals  Enc Vitals Group     BP 09/24/19 1745 (!) 140/92     Pulse Rate 09/24/19 1745 87     Resp 09/24/19 1745 16     Temp 09/24/19 1747 98.6 F (37 C)     Temp Source 09/24/19 1747 Oral     SpO2 09/24/19 1745 99 %     Weight --      Height --      Head Circumference --      Peak Flow --      Pain Score 09/24/19 1748 4     Pain Loc --      Pain Edu? --      Excl. in GC? --    No data found.  Updated Vital Signs BP (!) 144/96  (BP Location: Left Arm)   Pulse 85   Temp 98.6 F (37 C) (Oral)   Resp 18   LMP 11/01/2017   SpO2 99%   Visual Acuity Right Eye Distance:   Left Eye Distance:   Bilateral Distance:    Right Eye Near:   Left Eye Near:    Bilateral Near:     Physical Exam Vitals and nursing note reviewed.  Skin:    General: Skin is warm and dry.     Capillary Refill: Capillary refill takes less than 2 seconds.     Comments: 1 cm laceration to the palmar aspect of the distal third digit on the left hand.  Subcutaneous tissue visible.  There is no nailbed involvement  Neurological:     Mental Status: She is oriented to person, place, and time.      UC Treatments / Results  Labs (all labs ordered are listed, but only abnormal results are displayed) Labs Reviewed - No data to display  EKG   Radiology No results found.  Procedures Laceration Repair  Date/Time: 09/24/2019 7:16 PM Performed by: Hermelinda Medicus, PA-C Authorized by: Hermelinda Medicus, PA-C   Consent:    Consent obtained:  Verbal   Consent given by:  Patient   Risks discussed:  Infection, need for additional repair and poor wound healing   Alternatives discussed:  Referral Anesthesia (see MAR for exact dosages):    Anesthesia method:  Nerve block   Block needle gauge:  25 G   Block anesthetic:  Lidocaine 2% w/o epi Laceration details:    Location:  Finger   Finger location:  L long finger   Length (cm):  1   Depth (mm):  3 Exploration:    Hemostasis achieved with:  Direct pressure   Contaminated: no   Treatment:    Area cleansed with:  Saline and soap and water   Amount of cleaning:  Extensive   Irrigation solution:  Sterile saline and tap water   Irrigation volume:    Irrigation method:  Syringe   Visualized foreign bodies/material removed: yes   Skin repair:    Repair method:  Sutures   Suture size:  5-0   Suture material:  Prolene   Suture technique:  Simple interrupted   Number of sutures:   5 Approximation:    Approximation:  Loose Post-procedure details:    Dressing:  Antibiotic ointment, non-adherent dressing and bulky dressing  Patient tolerance of procedure:  Tolerated well, no immediate complications   (including critical care time)  Medications Ordered in UC Medications  Tdap (BOOSTRIX) injection 0.5 mL (0.5 mLs Intramuscular Given 09/24/19 1929)    Initial Impression / Assessment and Plan / UC Course  I have reviewed the triage vital signs and the nursing notes.  Pertinent labs & imaging results that were available during my care of the patient were reviewed by me and considered in my medical decision making (see chart for details).     #Finger laceration Patient is a 44 year old presenting with a roughly 18-hour old wound to left middle finger.  Given length since initial onset, some concern for infection with primary closure.  Digit block and extensive cleaning conducted.  Closed loosely with 5 sutures, do feel approximation will be adequate for healing however will also give slightly lower opportunity for infection to develop.  We will also place on prophylactic antibiotics, penicillin allergy is listed, will utilize clindamycin.  Instructed patient to return in 2 days to this clinic for reevaluation.  Tdap given.  Patient verbalized understanding plan of care. Final Clinical Impressions(s) / UC Diagnoses   Final diagnoses:  Laceration of left middle finger without foreign body without damage to nail, initial encounter     Discharge Instructions     Change bandage daily Clean with soap and water  Take medication as prescribed  Return on Sunday for re-evaluation    ED Prescriptions    Medication Sig Dispense Auth. Provider   clindamycin (CLEOCIN) 150 MG capsule Take 1 capsule (150 mg total) by mouth 3 (three) times daily for 5 days. 15 capsule Lashonne Shull, Veryl Speak, PA-C     PDMP not reviewed this encounter.   Hermelinda Medicus, PA-C 09/25/19 [phone]

## 2019-09-26 ENCOUNTER — Other Ambulatory Visit: Payer: Self-pay

## 2019-09-26 ENCOUNTER — Ambulatory Visit (HOSPITAL_COMMUNITY)
Admission: EM | Admit: 2019-09-26 | Discharge: 2019-09-26 | Disposition: A | Discharge status: Home / Self Care | Payer: BC Managed Care – PPO | Attending: Physician Assistant | Admitting: Physician Assistant

## 2019-09-26 ENCOUNTER — Encounter (HOSPITAL_COMMUNITY): Payer: Self-pay

## 2019-09-26 DIAGNOSIS — Z5189 Encounter for other specified aftercare: Secondary | ICD-10-CM

## 2019-09-26 DIAGNOSIS — J3489 Other specified disorders of nose and nasal sinuses: Secondary | ICD-10-CM

## 2019-09-26 HISTORY — DX: Essential (primary) hypertension: I10

## 2019-09-26 MED ORDER — MUPIROCIN CALCIUM 2 % EX CREA: 1.0000 "application " | TOPICAL_CREAM | Freq: Two times a day (BID) | CUTANEOUS | 0 refills | Status: DC

## 2019-09-26 NOTE — ED Provider Notes (Signed)
[facility]    CSN: [id number] Arrival date & time: 09/26/19  1530      History   Chief Complaint Chief Complaint  Patient presents with  . f/u finger    HPI Anita Harrington is a 44 y.o. female.   Nuys anyPatient returns for wound check.  She reports one stitch has popped.  Increase in pain or discharge.  She denied any fevers.  She also reports she is concerned about some bumps in the left nostril.  These have been present for several months.  They are occasionally painful.  She has tried Neosporin on these without much improvement.  She reports she does pick at these areas frequently.       Past Medical History:  Diagnosis Date  . Anxiety   . Hypertension     Patient Active Problem List   Diagnosis Date Noted  . Alcohol use disorder, severe, dependence (HCC) 06/10/2017  . Gallstone pancreatitis 10/17/2015  . Transaminasemia 10/17/2015  . Depression 10/17/2015  . Tobacco abuse 10/17/2015  . Pancreatitis 10/17/2015  . Other acute pancreatitis     Past Surgical History:  Procedure Laterality Date  . CHOLECYSTECTOMY N/A 10/19/2015   Procedure: LAPAROSCOPIC CHOLECYSTECTOMY with Intraoperative Cholangiogram;  Surgeon: Rodman Pickle, MD;  Location: [facility] OR;  Service: General;  Laterality: N/A;  . GASTRIC BYPASS    . OVARIAN CYST REMOVAL      OB History   No obstetric history on file.      Home Medications    Prior to Admission medications   Medication Sig Start Date End Date Taking? Authorizing Provider  ALPRAZolam Prudy Feeler) 0.5 MG tablet Take 1 mg by mouth 3 (three) times daily as needed for anxiety.     [provider]  ARIPiprazole (ABILIFY) 10 MG tablet Take 10 mg by mouth daily.    [provider]  busPIRone (BUSPAR) 5 MG tablet Take 1 tablet (5 mg total) by mouth 3 (three) times daily. Need to be refilled by PCP or psychiatry. Patient not taking: Reported on 10/16/2017 06/15/17   Ofilia Neas, PA-C  chlordiazePOXIDE  (LIBRIUM) 25 MG capsule Take 25 mg by mouth 2 (two) times daily. 08/31/19   [provider]  clindamycin (CLEOCIN) 150 MG capsule Take 1 capsule (150 mg total) by mouth 3 (three) times daily for 5 days. 09/24/19 09/29/19  Shivonne Schwartzman, Veryl Speak, PA-C  cloNIDine (CATAPRES) 0.1 MG tablet Take 0.1 mg by mouth daily. 09/03/19   [provider]  desvenlafaxine (PRISTIQ) 100 MG 24 hr tablet Take 100 mg by mouth every morning. 09/16/19   [provider]  escitalopram (LEXAPRO) 5 MG tablet Take 1 tablet (5 mg total) by mouth daily. Take with food.  Need to be refilled by PCP or psychiatry. Patient not taking: Reported on 10/16/2017 06/15/17   Ofilia Neas, PA-C  gabapentin (NEURONTIN) 300 MG capsule Take 300 mg by mouth 3 (three) times daily.    [provider]  lisdexamfetamine (VYVANSE) 30 MG capsule Take 50 mg by mouth daily.     [provider]  mupirocin cream (BACTROBAN) 2 % Apply 1 application topically 2 (two) times daily. 09/26/19   Telitha Plath, Veryl Speak, PA-C  REXULTI 2 MG TABS tablet Take 2 mg by mouth at bedtime. 09/12/19   [provider]  traZODone (DESYREL) 150 MG tablet Take 100 mg by mouth at bedtime.     [provider]    Family History Family History  Problem Relation Age  of Onset  . Diabetes Mother   . Diabetes Father     Social History Social History   Tobacco Use  . Smoking status: Current Every Day Smoker    Packs/day: 1.00    Years: 25.00    Pack years: 25.00  . Smokeless tobacco: Never Used  Vaping Use  . Vaping Use: Never used  Substance Use Topics  . Alcohol use: No  . Drug use: No     Allergies   Amoxicillin, Cefaclor, Ciprofloxacin hcl, Levaquin [levofloxacin in d5w], and Penicillins   Review of Systems Review of Systems   Physical Exam Triage Vital Signs ED Triage Vitals  Enc Vitals Group     BP 09/26/19 1551 (!) 135/95     Pulse Rate 09/26/19 1551 (!) 106     Resp 09/26/19 1551 16     Temp 09/26/19 1551  99.3 F (37.4 C)     Temp Source 09/26/19 1551 Oral     SpO2 09/26/19 1551 100 %     Weight 09/26/19 1552 142 lb (64.4 kg)     Height 09/26/19 1552 5\' 1"  (1.549 m)     Head Circumference --      Peak Flow --      Pain Score 09/26/19 1552 4     Pain Loc --      Pain Edu? --      Excl. in GC? --    No data found.  Updated Vital Signs BP (!) 135/95   Pulse (!) 106   Temp 99.3 F (37.4 C) (Oral)   Resp 16   Ht 5\' 1"  (1.549 m)   Wt 142 lb (64.4 kg)   LMP 11/01/2017   SpO2 100%   BMI 26.83 kg/m   Visual Acuity Right Eye Distance:   Left Eye Distance:   Bilateral Distance:    Right Eye Near:   Left Eye Near:    Bilateral Near:     Physical Exam Vitals and nursing note reviewed.  Constitutional:      General: She is not in acute distress.    Appearance: She is well-developed.  HENT:     Head: Normocephalic and atraumatic.     Nose:     Comments: Small white bump on medial aspect of the nasal mucosa just inside the nasal cavity.  No bleeding.  Otherwise normal exam Eyes:     Conjunctiva/sclera: Conjunctivae normal.  Cardiovascular:     Rate and Rhythm: Normal rate.     Heart sounds: No murmur heard.   Pulmonary:     Effort: Pulmonary effort is normal. No respiratory distress.  Musculoskeletal:     Cervical back: Neck supple.  Skin:    General: Skin is warm and dry.     Comments: Laceration with sutures in place on left middle finger.  Central suture has popped.  Wound edges are fairly well approximated.  No evidence of infection today.  Tissue does not appear necrotic.  Neurological:     Mental Status: She is alert.      UC Treatments / Results  Labs (all labs ordered are listed, but only abnormal results are displayed) Labs Reviewed - No data to display  EKG   Radiology No results found.  Procedures Procedures (including critical care time)  Medications Ordered in UC Medications - No data to display  Initial Impression / Assessment and Plan /  UC Course  I have reviewed the triage vital signs and the nursing notes.  Pertinent labs &  imaging results that were available during my care of the patient were reviewed by me and considered in my medical decision making (see chart for details).     #Wound check #Laceration finger #Nasal lesion Patient is a 44 year old presenting for wound check of laceration of left middle finger and concern of nasal lesions.  Unclear ideology of nasal lesions.  Will try mupirocin and encouraged her to follow-up with her primary care for evaluation given the chronicity of this.  Given timeframe of laceration initial visit there was some pause in closing the wound, however does not appear to be any infection.  However at this point risk of placing another suture would be too high.  Do feel this will heal well over time, will place Steri-Strips.  Discussed wound care.  Encourage patient to follow-up with her primary care about this in about 10 days as well as her nasal concern.  Discussed regardless that she should have just checked in about 10 days for possible suture removal.  Discussed signs of infections and reason to return prior.  Instructed to continue the clindamycin.  Patient verbalized understanding agreement the plan. Final Clinical Impressions(s) / UC Diagnoses   Final diagnoses:  Visit for wound check  Nasal lesion     Discharge Instructions     Try the cream in your nostril  Change bandage daily Reapply steri strips daily if needed Have wound evaluated in about 10 days  Schedule follow up with your primary care for both nostril bump and wound check  Return if signs of infection as discussed     ED Prescriptions    Medication Sig Dispense Auth. Provider   mupirocin cream (BACTROBAN) 2 % Apply 1 application topically 2 (two) times daily. 15 g Cherye Gaertner, Veryl Speak, PA-C     PDMP not reviewed this encounter.   Hermelinda Medicus, PA-C 09/26/19 1942

## 2019-09-26 NOTE — Discharge Instructions (Signed)
Try the cream in your nostril  Change bandage daily Reapply steri strips daily if needed Have wound evaluated in about 10 days  Schedule follow up with your primary care for both nostril bump and wound check  Return if signs of infection as discussed

## 2019-09-26 NOTE — ED Triage Notes (Signed)
PT here for f/u for finger. Pt states 1 stitch came out of finger.

## 2019-10-07 ENCOUNTER — Other Ambulatory Visit: Payer: Self-pay

## 2019-10-07 ENCOUNTER — Ambulatory Visit (HOSPITAL_COMMUNITY)
Admission: EM | Admit: 2019-10-07 | Discharge: 2019-10-07 | Disposition: A | Discharge status: Home / Self Care | Payer: BC Managed Care – PPO | Attending: Family Medicine | Admitting: Family Medicine

## 2019-10-07 ENCOUNTER — Encounter (HOSPITAL_COMMUNITY): Payer: Self-pay | Admitting: *Deleted

## 2019-10-07 DIAGNOSIS — N3090 Cystitis, unspecified without hematuria: Secondary | ICD-10-CM

## 2019-10-07 LAB — POCT URINALYSIS DIPSTICK, ED / UC
Glucose, UA: NEGATIVE mg/dL
Hgb urine dipstick: NEGATIVE
Nitrite: NEGATIVE
Protein, ur: NEGATIVE mg/dL
Specific Gravity, Urine: >=1.03 (ref 1.005–1.030)
Urobilinogen, UA: 0.2 mg/dL (ref 0.0–1.0)
pH: 5.5 (ref 5.0–8.0)

## 2019-10-07 MED ORDER — NITROFURANTOIN MONOHYD MACRO 100 MG PO CAPS: 100.0000 mg | ORAL_CAPSULE | Freq: Two times a day (BID) | ORAL | 0 refills | Status: AC

## 2019-10-07 MED ORDER — PHENAZOPYRIDINE HCL 200 MG PO TABS: 200.0000 mg | ORAL_TABLET | Freq: Three times a day (TID) | ORAL | 0 refills | Status: AC

## 2019-10-07 MED ORDER — MUPIROCIN 2 % EX OINT: TOPICAL_OINTMENT | CUTANEOUS | 0 refills | Status: AC

## 2019-10-07 NOTE — ED Triage Notes (Signed)
Patient here for suture removal to left middle finger, sutures placed on 09/26/19 at [facility]. States finished antibiotics and has been changing dressing.   States she would also like to see if there is an option for changing the prescription for the cream she was given for bumps inside left nair that get dry and uncomfortable.  States she also has been having dysuria and vaginal discomfort since she had sex with her husband. Does not think it is an UTI (denies all other symptoms) or a STD.

## 2019-10-07 NOTE — ED Notes (Signed)
Left anterior laceration repaired with 4 sutures (5th popped out) is well approximated and healed. No signs of infection. Patient given directions to continue to keep clean for the next couple days while it finishes healing.

## 2019-10-07 NOTE — Discharge Instructions (Signed)
Drink more water Take the antibiotic 2 x a day Take pyridium as needed

## 2019-10-07 NOTE — ED Provider Notes (Signed)
[facility]    CSN: [id number] Arrival date & time: 10/07/19  1814      History   Chief Complaint Chief Complaint  Patient presents with  . Suture / Staple Removal  . Follow-up  . Polyuria    HPI Anita Harrington is a 44 y.o. female.   HPI  Patient is here for suture removal.  Removed from her left middle finger.  Tip of her finger is numb.  I examined the wound and it does not appear to have nerve involvement, I reassured her that the new numbness should resolve She also has dysuria and wonders whether she has a UTI. She also complains that the mupirocin prescribed at last visit was too expensive.  She would like a different prescription.  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  Likely from smoking  Past Medical History:  Diagnosis Date  . Anxiety   . Hypertension     Patient Active Problem List   Diagnosis Date Noted  . Alcohol use disorder, severe, dependence (HCC) 06/10/2017  . Gallstone pancreatitis 10/17/2015  . Transaminasemia 10/17/2015  . Depression 10/17/2015  . Tobacco abuse 10/17/2015  . Pancreatitis 10/17/2015  . Other acute pancreatitis     Past Surgical History:  Procedure Laterality Date  . CHOLECYSTECTOMY N/A 10/19/2015   Procedure: LAPAROSCOPIC CHOLECYSTECTOMY with Intraoperative Cholangiogram;  Surgeon: Rodman Pickle, MD;  Location: [facility] OR;  Service: General;  Laterality: N/A;  . GASTRIC BYPASS    . OVARIAN CYST REMOVAL      OB History   No obstetric history on file.      Home Medications    Prior to Admission medications   Medication Sig Start Date End Date Taking? Authorizing Provider  ALPRAZolam Prudy Feeler) 0.5 MG tablet Take 1 mg by mouth 3 (three) times daily as needed for anxiety.     [provider]  ARIPiprazole (ABILIFY) 10 MG tablet Take 10 mg by mouth daily.    [provider]  busPIRone (BUSPAR) 5 MG tablet Take 1 tablet (5 mg total) by mouth 3 (three) times daily. Need  to be refilled by PCP or psychiatry. Patient not taking: Reported on 10/16/2017 06/15/17   Ofilia Neas, PA-C  chlordiazePOXIDE (LIBRIUM) 25 MG capsule Take 25 mg by mouth 2 (two) times daily. 08/31/19   [provider]  cloNIDine (CATAPRES) 0.1 MG tablet Take 0.1 mg by mouth daily. 09/03/19   [provider]  desvenlafaxine (PRISTIQ) 100 MG 24 hr tablet Take 100 mg by mouth every morning. 09/16/19   [provider]  escitalopram (LEXAPRO) 5 MG tablet Take 1 tablet (5 mg total) by mouth daily. Take with food.  Need to be refilled by PCP or psychiatry. Patient not taking: Reported on 10/16/2017 06/15/17   Ofilia Neas, PA-C  gabapentin (NEURONTIN) 300 MG capsule Take 300 mg by mouth 3 (three) times daily.    [provider]  lisdexamfetamine (VYVANSE) 30 MG capsule Take 50 mg by mouth daily.     [provider]  mupirocin ointment (BACTROBAN) 2 % Apply to rash two times a day 10/07/19   Eustace Moore, MD  nitrofurantoin, macrocrystal-monohydrate, (MACROBID) 100 MG capsule Take 1 capsule (100 mg total) by mouth 2 (two) times daily. 10/07/19   Eustace Moore, MD  phenazopyridine (PYRIDIUM) 200 MG tablet Take 1 tablet (200 mg total) by mouth 3 (three) times daily. 10/07/19   Eustace Moore, MD  REXULTI 2 MG TABS  tablet Take 2 mg by mouth at bedtime. 09/12/19   [provider]  traZODone (DESYREL) 150 MG tablet Take 100 mg by mouth at bedtime.     [provider]    Family History Family History  Problem Relation Age of Onset  . Diabetes Mother   . Diabetes Father     Social History Social History   Tobacco Use  . Smoking status: Current Every Day Smoker    Packs/day: 1.00    Years: 25.00    Pack years: 25.00  . Smokeless tobacco: Never Used  Vaping Use  . Vaping Use: Never used  Substance Use Topics  . Alcohol use: No  . Drug use: No     Allergies   Amoxicillin, Cefaclor, Ciprofloxacin hcl, Levaquin  [levofloxacin in d5w], and Penicillins   Review of Systems Review of Systems See HPI  Physical Exam Triage Vital Signs ED Triage Vitals  Enc Vitals Group     BP 10/07/19 1935 128/79     Pulse Rate 10/07/19 1935 97     Resp 10/07/19 1935 16     Temp 10/07/19 1935 98.3 F (36.8 C)     Temp Source 10/07/19 1935 Oral     SpO2 10/07/19 1935 100 %     Weight --      Height --      Head Circumference --      Peak Flow --      Pain Score 10/07/19 1926 3     Pain Loc --      Pain Edu? --      Excl. in GC? --    No data found.  Updated Vital Signs BP 128/79 (BP Location: Right Arm)   Pulse 97   Temp 98.3 F (36.8 C) (Oral)   Resp 16   LMP 11/01/2017   SpO2 100%      Physical Exam Constitutional:      General: She is not in acute distress.    Appearance: She is well-developed and normal weight.  HENT:     Head: Normocephalic and atraumatic.     Nose:     Comments: Small nasal passages.  No irritation or wound    Mouth/Throat:     Comments: Mask is in place Eyes:     Conjunctiva/sclera: Conjunctivae normal.     Pupils: Pupils are equal, round, and reactive to light.  Cardiovascular:     Rate and Rhythm: Normal rate.  Pulmonary:     Effort: Pulmonary effort is normal. No respiratory distress.  Abdominal:     General: There is no distension.     Palpations: Abdomen is soft.     Tenderness: There is no right CVA tenderness or left CVA tenderness.  Musculoskeletal:        General: Normal range of motion.     Cervical back: Normal range of motion.  Skin:    General: Skin is warm and dry.     Comments: Fingertip is healing.  Clean dry intact.  Neurological:     General: No focal deficit present.     Mental Status: She is alert.  Psychiatric:        Behavior: Behavior normal.      UC Treatments / Results  Labs (all labs ordered are listed, but only abnormal results are displayed) Labs Reviewed  POCT URINALYSIS DIPSTICK, ED / UC - Abnormal; Notable for the  following components:      Result Value   Bilirubin Urine SMALL (*)  Ketones, ur TRACE (*)    Leukocytes,Ua TRACE (*)    All other components within normal limits  URINE CULTURE    EKG   Radiology No results found.  Procedures Procedures (including critical care time)  Medications Ordered in UC Medications - No data to display  Initial Impression / Assessment and Plan / UC Course  I have reviewed the triage vital signs and the nursing notes.  Pertinent labs & imaging results that were available during my care of the patient were reviewed by me and considered in my medical decision making (see chart for details).     I represcribed her mupirocin for less expensive alternative. We will culture her urine since there are trace leukocytes and she is symptomatic.  Have covered her with Macrodantin and Pyridium Reassurance regarding the finger Final Clinical Impressions(s) / UC Diagnoses   Final diagnoses:  Cystitis     Discharge Instructions     Drink more water Take the antibiotic 2 x a day Take pyridium as needed   ED Prescriptions    Medication Sig Dispense Auth. Provider   mupirocin ointment (BACTROBAN) 2 % Apply to rash two times a day 22 g Eustace Moore, MD   nitrofurantoin, macrocrystal-monohydrate, (MACROBID) 100 MG capsule Take 1 capsule (100 mg total) by mouth 2 (two) times daily. 10 capsule Eustace Moore, MD   phenazopyridine (PYRIDIUM) 200 MG tablet Take 1 tablet (200 mg total) by mouth 3 (three) times daily. 6 tablet Eustace Moore, MD     PDMP not reviewed this encounter.   Eustace Moore, MD 10/07/19 [id number]

## 2019-10-08 LAB — URINE CULTURE: Culture: <10000 — AB

## 2020-12-03 ENCOUNTER — Inpatient Hospital Stay (HOSPITAL_COMMUNITY): Payer: BC Managed Care – PPO

## 2020-12-03 ENCOUNTER — Emergency Department (HOSPITAL_COMMUNITY): Payer: BC Managed Care – PPO

## 2020-12-03 ENCOUNTER — Inpatient Hospital Stay (HOSPITAL_COMMUNITY)
Admission: EM | Admit: 2020-12-03 | Discharge: 2020-12-26 | DRG: 296 | Disposition: E | Payer: BC Managed Care – PPO | Attending: Internal Medicine | Admitting: Internal Medicine

## 2020-12-03 DIAGNOSIS — J9601 Acute respiratory failure with hypoxia: Secondary | ICD-10-CM | POA: Diagnosis present

## 2020-12-03 DIAGNOSIS — F419 Anxiety disorder, unspecified: Secondary | ICD-10-CM | POA: Diagnosis present

## 2020-12-03 DIAGNOSIS — Z888 Allergy status to other drugs, medicaments and biological substances status: Secondary | ICD-10-CM

## 2020-12-03 DIAGNOSIS — Z20822 Contact with and (suspected) exposure to covid-19: Secondary | ICD-10-CM | POA: Diagnosis present

## 2020-12-03 DIAGNOSIS — F1020 Alcohol dependence, uncomplicated: Secondary | ICD-10-CM | POA: Diagnosis present

## 2020-12-03 DIAGNOSIS — Z66 Do not resuscitate: Secondary | ICD-10-CM | POA: Diagnosis not present

## 2020-12-03 DIAGNOSIS — K7040 Alcoholic hepatic failure without coma: Secondary | ICD-10-CM | POA: Diagnosis present

## 2020-12-03 DIAGNOSIS — Z880 Allergy status to penicillin: Secondary | ICD-10-CM

## 2020-12-03 DIAGNOSIS — I10 Essential (primary) hypertension: Secondary | ICD-10-CM | POA: Diagnosis present

## 2020-12-03 DIAGNOSIS — R570 Cardiogenic shock: Secondary | ICD-10-CM | POA: Diagnosis present

## 2020-12-03 DIAGNOSIS — D539 Nutritional anemia, unspecified: Secondary | ICD-10-CM | POA: Diagnosis present

## 2020-12-03 DIAGNOSIS — I469 Cardiac arrest, cause unspecified: Secondary | ICD-10-CM | POA: Diagnosis present

## 2020-12-03 DIAGNOSIS — Z882 Allergy status to sulfonamides status: Secondary | ICD-10-CM | POA: Diagnosis not present

## 2020-12-03 DIAGNOSIS — R17 Unspecified jaundice: Secondary | ICD-10-CM | POA: Diagnosis not present

## 2020-12-03 DIAGNOSIS — E872 Acidosis, unspecified: Secondary | ICD-10-CM | POA: Diagnosis present

## 2020-12-03 DIAGNOSIS — K7010 Alcoholic hepatitis without ascites: Secondary | ICD-10-CM

## 2020-12-03 DIAGNOSIS — D696 Thrombocytopenia, unspecified: Secondary | ICD-10-CM | POA: Diagnosis present

## 2020-12-03 DIAGNOSIS — R578 Other shock: Secondary | ICD-10-CM | POA: Diagnosis present

## 2020-12-03 DIAGNOSIS — G9341 Metabolic encephalopathy: Secondary | ICD-10-CM | POA: Diagnosis present

## 2020-12-03 DIAGNOSIS — Z833 Family history of diabetes mellitus: Secondary | ICD-10-CM

## 2020-12-03 DIAGNOSIS — J690 Pneumonitis due to inhalation of food and vomit: Secondary | ICD-10-CM | POA: Diagnosis present

## 2020-12-03 DIAGNOSIS — F909 Attention-deficit hyperactivity disorder, unspecified type: Secondary | ICD-10-CM | POA: Diagnosis present

## 2020-12-03 DIAGNOSIS — N179 Acute kidney failure, unspecified: Secondary | ICD-10-CM | POA: Diagnosis not present

## 2020-12-03 DIAGNOSIS — K7030 Alcoholic cirrhosis of liver without ascites: Secondary | ICD-10-CM

## 2020-12-03 DIAGNOSIS — G931 Anoxic brain damage, not elsewhere classified: Secondary | ICD-10-CM | POA: Diagnosis present

## 2020-12-03 DIAGNOSIS — Z79899 Other long term (current) drug therapy: Secondary | ICD-10-CM | POA: Diagnosis not present

## 2020-12-03 DIAGNOSIS — R040 Epistaxis: Secondary | ICD-10-CM | POA: Diagnosis present

## 2020-12-03 DIAGNOSIS — K7031 Alcoholic cirrhosis of liver with ascites: Secondary | ICD-10-CM | POA: Diagnosis present

## 2020-12-03 DIAGNOSIS — F17210 Nicotine dependence, cigarettes, uncomplicated: Secondary | ICD-10-CM | POA: Diagnosis present

## 2020-12-03 LAB — I-STAT ARTERIAL BLOOD GAS, ED
Acid-base deficit: 15 mmol/L — ABNORMAL HIGH (ref 0.0–2.0)
Acid-base deficit: 17 mmol/L — ABNORMAL HIGH (ref 0.0–2.0)
Acid-base deficit: 19 mmol/L — ABNORMAL HIGH (ref 0.0–2.0)
Bicarbonate: 10.6 mmol/L — ABNORMAL LOW (ref 20.0–28.0)
Bicarbonate: 12.7 mmol/L — ABNORMAL LOW (ref 20.0–28.0)
Bicarbonate: 8.5 mmol/L — ABNORMAL LOW (ref 20.0–28.0)
Calcium, Ion: 0.92 mmol/L — ABNORMAL LOW (ref 1.15–1.40)
Calcium, Ion: 0.94 mmol/L — ABNORMAL LOW (ref 1.15–1.40)
Calcium, Ion: 0.93 mmol/L — ABNORMAL LOW (ref 1.15–1.40)
HCT: 24 % — ABNORMAL LOW (ref 36.0–46.0)
HCT: 27 % — ABNORMAL LOW (ref 36.0–46.0)
HCT: 22 % — ABNORMAL LOW (ref 36.0–46.0)
Hemoglobin: 8.2 g/dL — ABNORMAL LOW (ref 12.0–15.0)
Hemoglobin: 9.2 g/dL — ABNORMAL LOW (ref 12.0–15.0)
Hemoglobin: 7.5 g/dL — ABNORMAL LOW (ref 12.0–15.0)
O2 Saturation: 100 %
O2 Saturation: 84 %
O2 Saturation: 89 %
Potassium: 3.4 mmol/L — ABNORMAL LOW (ref 3.5–5.1)
Potassium: 3.5 mmol/L (ref 3.5–5.1)
Potassium: 3.7 mmol/L (ref 3.5–5.1)
Sodium: 142 mmol/L (ref 135–145)
Sodium: 143 mmol/L (ref 135–145)
Sodium: 142 mmol/L (ref 135–145)
TCO2: 11 mmol/L — ABNORMAL LOW (ref 22–32)
TCO2: 14 mmol/L — ABNORMAL LOW (ref 22–32)
TCO2: 9 mmol/L — ABNORMAL LOW (ref 22–32)
pCO2 arterial: 29.6 mmHg — ABNORMAL LOW (ref 32.0–48.0)
pCO2 arterial: 38.3 mmHg (ref 32.0–48.0)
pCO2 arterial: 26.4 mmHg — ABNORMAL LOW (ref 32.0–48.0)
pH, Arterial: 7.128 — CL (ref 7.350–7.450)
pH, Arterial: 7.16 — CL (ref 7.350–7.450)
pH, Arterial: 7.115 — CL (ref 7.350–7.450)
pO2, Arterial: 247 mmHg — ABNORMAL HIGH (ref 83.0–108.0)
pO2, Arterial: 63 mmHg — ABNORMAL LOW (ref 83.0–108.0)
pO2, Arterial: 74 mmHg — ABNORMAL LOW (ref 83.0–108.0)

## 2020-12-03 LAB — ABO/RH: ABO/RH(D): O NEG

## 2020-12-03 LAB — COMPREHENSIVE METABOLIC PANEL
ALT: 74 U/L — ABNORMAL HIGH (ref 0–44)
ALT: 63 U/L — ABNORMAL HIGH (ref 0–44)
AST: 274 U/L — ABNORMAL HIGH (ref 15–41)
AST: 250 U/L — ABNORMAL HIGH (ref 15–41)
Albumin: 2.4 g/dL — ABNORMAL LOW (ref 3.5–5.0)
Albumin: 1.9 g/dL — ABNORMAL LOW (ref 3.5–5.0)
Alkaline Phosphatase: 77 U/L (ref 38–126)
Alkaline Phosphatase: 40 U/L (ref 38–126)
Anion gap: 23 — ABNORMAL HIGH (ref 5–15)
Anion gap: 28 — ABNORMAL HIGH (ref 5–15)
BUN: 18 mg/dL (ref 6–20)
BUN: 18 mg/dL (ref 6–20)
CO2: 13 mmol/L — ABNORMAL LOW (ref 22–32)
CO2: 8 mmol/L — ABNORMAL LOW (ref 22–32)
Calcium: 8.3 mg/dL — ABNORMAL LOW (ref 8.9–10.3)
Calcium: 6.6 mg/dL — ABNORMAL LOW (ref 8.9–10.3)
Chloride: 103 mmol/L (ref 98–111)
Chloride: 107 mmol/L (ref 98–111)
Creatinine, Ser: 1.7 mg/dL — ABNORMAL HIGH (ref 0.44–1.00)
Creatinine, Ser: 1.49 mg/dL — ABNORMAL HIGH (ref 0.44–1.00)
GFR, Estimated: 37 mL/min — ABNORMAL LOW (ref >60)
GFR, Estimated: 44 mL/min — ABNORMAL LOW (ref >60)
Glucose, Bld: 84 mg/dL (ref 70–99)
Glucose, Bld: 179 mg/dL — ABNORMAL HIGH (ref 70–99)
Potassium: 5 mmol/L (ref 3.5–5.1)
Potassium: 4.1 mmol/L (ref 3.5–5.1)
Sodium: 139 mmol/L (ref 135–145)
Sodium: 143 mmol/L (ref 135–145)
Total Bilirubin: 11.2 mg/dL — ABNORMAL HIGH (ref 0.3–1.2)
Total Bilirubin: 5.7 mg/dL — ABNORMAL HIGH (ref 0.3–1.2)
Total Protein: 6.9 g/dL (ref 6.5–8.1)
Total Protein: 4.1 g/dL — ABNORMAL LOW (ref 6.5–8.1)

## 2020-12-03 LAB — ETHANOL: Alcohol, Ethyl (B): <10 mg/dL (ref <10)

## 2020-12-03 LAB — POCT I-STAT 7, (LYTES, BLD GAS, ICA,H+H)
Acid-base deficit: 20 mmol/L — ABNORMAL HIGH (ref 0.0–2.0)
Bicarbonate: 8.7 mmol/L — ABNORMAL LOW (ref 20.0–28.0)
Calcium, Ion: 0.82 mmol/L — CL (ref 1.15–1.40)
HCT: 21 % — ABNORMAL LOW (ref 36.0–46.0)
Hemoglobin: 7.1 g/dL — ABNORMAL LOW (ref 12.0–15.0)
O2 Saturation: 98 %
Patient temperature: 33
Potassium: 4 mmol/L (ref 3.5–5.1)
Sodium: 143 mmol/L (ref 135–145)
TCO2: 10 mmol/L — ABNORMAL LOW (ref 22–32)
pCO2 arterial: 27.3 mmHg — ABNORMAL LOW (ref 32.0–48.0)
pH, Arterial: 7.085 — CL (ref 7.350–7.450)
pO2, Arterial: 137 mmHg — ABNORMAL HIGH (ref 83.0–108.0)

## 2020-12-03 LAB — CBC WITH DIFFERENTIAL/PLATELET
Abs Immature Granulocytes: 1.27 10*3/uL — ABNORMAL HIGH (ref 0.00–0.07)
Basophils Absolute: 0.1 10*3/uL (ref 0.0–0.1)
Basophils Relative: 1 %
Eosinophils Absolute: 0 10*3/uL (ref 0.0–0.5)
Eosinophils Relative: 0 %
HCT: 35.4 % — ABNORMAL LOW (ref 36.0–46.0)
Hemoglobin: 11.5 g/dL — ABNORMAL LOW (ref 12.0–15.0)
Immature Granulocytes: 9 %
Lymphocytes Relative: 13 %
Lymphs Abs: 1.8 10*3/uL (ref 0.7–4.0)
MCH: 42.4 pg — ABNORMAL HIGH (ref 26.0–34.0)
MCHC: 32.5 g/dL (ref 30.0–36.0)
MCV: 130.6 fL — ABNORMAL HIGH (ref 80.0–100.0)
Monocytes Absolute: 1.5 10*3/uL — ABNORMAL HIGH (ref 0.1–1.0)
Monocytes Relative: 11 %
Neutro Abs: 9 10*3/uL — ABNORMAL HIGH (ref 1.7–7.7)
Neutrophils Relative %: 66 %
Platelets: 59 10*3/uL — ABNORMAL LOW (ref 150–400)
RBC: 2.71 MIL/uL — ABNORMAL LOW (ref 3.87–5.11)
RDW: 22.2 % — ABNORMAL HIGH (ref 11.5–15.5)
WBC: 13.7 10*3/uL — ABNORMAL HIGH (ref 4.0–10.5)
nRBC: 1.6 % — ABNORMAL HIGH (ref 0.0–0.2)

## 2020-12-03 LAB — URINALYSIS, ROUTINE W REFLEX MICROSCOPIC
Glucose, UA: 50 mg/dL — AB
Ketones, ur: NEGATIVE mg/dL
Leukocytes,Ua: NEGATIVE
Nitrite: POSITIVE — AB
Protein, ur: 100 mg/dL — AB
Specific Gravity, Urine: 1.015 (ref 1.005–1.030)
WBC, UA: >50 WBC/hpf — ABNORMAL HIGH (ref 0–5)
pH: 5 (ref 5.0–8.0)

## 2020-12-03 LAB — ACETAMINOPHEN LEVEL: Acetaminophen (Tylenol), Serum: <10 ug/mL — ABNORMAL LOW (ref 10–30)

## 2020-12-03 LAB — CBC
HCT: 23.1 % — ABNORMAL LOW (ref 36.0–46.0)
Hemoglobin: 7.2 g/dL — ABNORMAL LOW (ref 12.0–15.0)
MCH: 39.8 pg — ABNORMAL HIGH (ref 26.0–34.0)
MCHC: 31.2 g/dL (ref 30.0–36.0)
MCV: 127.6 fL — ABNORMAL HIGH (ref 80.0–100.0)
Platelets: 32 10*3/uL — ABNORMAL LOW (ref 150–400)
RBC: 1.81 MIL/uL — ABNORMAL LOW (ref 3.87–5.11)
WBC: 16.8 10*3/uL — ABNORMAL HIGH (ref 4.0–10.5)
nRBC: 2.2 % — ABNORMAL HIGH (ref 0.0–0.2)

## 2020-12-03 LAB — FIBRINOGEN: Fibrinogen: <60 mg/dL — CL (ref 210–475)

## 2020-12-03 LAB — LACTIC ACID, PLASMA: Lactic Acid, Venous: >9 mmol/L (ref 0.5–1.9)

## 2020-12-03 LAB — RAPID URINE DRUG SCREEN, HOSP PERFORMED
Amphetamines: POSITIVE — AB
Barbiturates: NOT DETECTED
Benzodiazepines: POSITIVE — AB
Cocaine: NOT DETECTED
Opiates: NOT DETECTED
Tetrahydrocannabinol: NOT DETECTED

## 2020-12-03 LAB — I-STAT BETA HCG BLOOD, ED (MC, WL, AP ONLY): I-stat hCG, quantitative: <5 m[IU]/mL (ref <5)

## 2020-12-03 LAB — MAGNESIUM: Magnesium: 2.6 mg/dL — ABNORMAL HIGH (ref 1.7–2.4)

## 2020-12-03 LAB — SALICYLATE LEVEL: Salicylate Lvl: 11.8 mg/dL (ref 7.0–30.0)

## 2020-12-03 LAB — TROPONIN I (HIGH SENSITIVITY): Troponin I (High Sensitivity): 199 ng/L (ref <18)

## 2020-12-03 LAB — RESP PANEL BY RT-PCR (FLU A&B, COVID) ARPGX2
Influenza A by PCR: NEGATIVE
Influenza B by PCR: NEGATIVE
SARS Coronavirus 2 by RT PCR: NEGATIVE

## 2020-12-03 LAB — GLUCOSE, CAPILLARY: Glucose-Capillary: 153 mg/dL — ABNORMAL HIGH (ref 70–99)

## 2020-12-03 LAB — CORTISOL: Cortisol, Plasma: >100 ug/dL

## 2020-12-03 LAB — CBG MONITORING, ED: Glucose-Capillary: 79 mg/dL (ref 70–99)

## 2020-12-03 LAB — PROTIME-INR
INR: 5.6 (ref 0.8–1.2)
Prothrombin Time: 51 seconds — ABNORMAL HIGH (ref 11.4–15.2)

## 2020-12-03 LAB — HIV ANTIBODY (ROUTINE TESTING W REFLEX): HIV Screen 4th Generation wRfx: NONREACTIVE

## 2020-12-03 LAB — APTT: aPTT: 96 seconds — ABNORMAL HIGH (ref 24–36)

## 2020-12-03 LAB — PREPARE RBC (CROSSMATCH)

## 2020-12-03 MED ORDER — FENTANYL BOLUS VIA INFUSION
50.0000 ug | INTRAVENOUS | Status: DC | PRN
  Filled 2020-12-03: qty 100

## 2020-12-03 MED ORDER — NOREPINEPHRINE 16 MG/250ML-% IV SOLN
0.0000 ug/min | INTRAVENOUS | Status: DC
Start: 2020-12-03 — End: 2020-12-04
  Administered 2020-12-03: 40 ug/min via INTRAVENOUS
  Filled 2020-12-03: qty 250

## 2020-12-03 MED ORDER — TRANEXAMIC ACID FOR EPISTAXIS
500.0000 mg | Freq: Once | TOPICAL | Status: DC
  Filled 2020-12-03: qty 10

## 2020-12-03 MED ORDER — FENTANYL CITRATE PF 50 MCG/ML IJ SOSY
100.0000 ug | PREFILLED_SYRINGE | INTRAMUSCULAR | Status: DC | PRN
Start: 2020-12-03 — End: 2020-12-04

## 2020-12-03 MED ORDER — OXYMETAZOLINE HCL 0.05 % NA SOLN
4.0000 | Freq: Four times a day (QID) | NASAL | Status: DC | PRN
  Administered 2020-12-03: 8 via NASAL
  Filled 2020-12-03: qty 30

## 2020-12-03 MED ORDER — NOREPINEPHRINE 4 MG/250ML-% IV SOLN: 2.0000 ug/min | INTRAVENOUS | Status: DC

## 2020-12-03 MED ORDER — MAGNESIUM SULFATE 2 GM/50ML IV SOLN
2.0000 g | Freq: Once | INTRAVENOUS | Status: AC
  Administered 2020-12-03: 2 g via INTRAVENOUS
  Filled 2020-12-03: qty 50

## 2020-12-03 MED ORDER — SODIUM CHLORIDE 0.9 % IV SOLN
2.0000 g | Freq: Once | INTRAVENOUS | Status: AC
  Administered 2020-12-03: 2 g via INTRAVENOUS
  Filled 2020-12-03: qty 2

## 2020-12-03 MED ORDER — PANTOPRAZOLE SODIUM 40 MG IV SOLR: 40.0000 mg | Freq: Every day | INTRAVENOUS | Status: DC

## 2020-12-03 MED ORDER — SODIUM CHLORIDE 0.9 % IV SOLN
50.0000 ug/h | INTRAVENOUS | Status: DC
  Administered 2020-12-03: 50 ug/h via INTRAVENOUS
  Filled 2020-12-03 (×2): qty 1

## 2020-12-03 MED ORDER — MIDAZOLAM HCL 2 MG/2ML IJ SOLN
2.0000 mg | INTRAMUSCULAR | Status: DC | PRN
Start: 2020-12-03 — End: 2020-12-04

## 2020-12-03 MED ORDER — FENTANYL 2500MCG IN NS 250ML (10MCG/ML) PREMIX INFUSION: 50.0000 ug/h | INTRAVENOUS | Status: DC

## 2020-12-03 MED ORDER — POLYETHYLENE GLYCOL 3350 17 G PO PACK: 17.0000 g | PACK | Freq: Every day | ORAL | Status: DC | PRN

## 2020-12-03 MED ORDER — NOREPINEPHRINE 4 MG/250ML-% IV SOLN
0.0000 ug/min | INTRAVENOUS | Status: DC
  Administered 2020-12-03: 20 ug/min via INTRAVENOUS
  Administered 2020-12-03: 35 ug/min via INTRAVENOUS
  Administered 2020-12-03: 32 ug/min via INTRAVENOUS
  Administered 2020-12-03: 28 ug/min via INTRAVENOUS
  Administered 2020-12-03: 30 ug/min via INTRAVENOUS
  Administered 2020-12-03: 20 ug/min via INTRAVENOUS
  Administered 2020-12-03: 25 ug/min via INTRAVENOUS
  Administered 2020-12-03: 40 ug/min via INTRAVENOUS
  Administered 2020-12-03: 38 ug/min via INTRAVENOUS
  Filled 2020-12-03 (×2): qty 250

## 2020-12-03 MED ORDER — VANCOMYCIN VARIABLE DOSE PER UNSTABLE RENAL FUNCTION (PHARMACIST DOSING): Status: DC

## 2020-12-03 MED ORDER — PANTOPRAZOLE SODIUM 40 MG IV SOLR: 40.0000 mg | Freq: Two times a day (BID) | INTRAVENOUS | Status: DC

## 2020-12-03 MED ORDER — MIDAZOLAM HCL 2 MG/2ML IJ SOLN: 2.0000 mg | INTRAMUSCULAR | Status: DC | PRN

## 2020-12-03 MED ORDER — SODIUM CHLORIDE 0.9 % IV SOLN
1.0000 g | Freq: Once | INTRAVENOUS | Status: AC
  Administered 2020-12-03: 1 g via INTRAVENOUS
  Filled 2020-12-03: qty 10

## 2020-12-03 MED ORDER — PANTOPRAZOLE INFUSION (NEW) - SIMPLE MED
8.0000 mg/h | INTRAVENOUS | Status: DC
Start: 2020-12-03 — End: 2020-12-04
  Administered 2020-12-03: 8 mg/h via INTRAVENOUS
  Filled 2020-12-03: qty 100
  Filled 2020-12-03: qty 80

## 2020-12-03 MED ORDER — ACETAMINOPHEN 160 MG/5ML PO SOLN: 650.0000 mg | ORAL | Status: DC

## 2020-12-03 MED ORDER — EPINEPHRINE HCL 5 MG/250ML IV SOLN IN NS
0.5000 ug/min | INTRAVENOUS | Status: DC
  Administered 2020-12-03: 18 ug/min via INTRAVENOUS
  Administered 2020-12-03: 15 ug/min via INTRAVENOUS
  Administered 2020-12-03: 12 ug/min via INTRAVENOUS
  Administered 2020-12-03: 10 ug/min via INTRAVENOUS
  Administered 2020-12-03: 20 ug/min via INTRAVENOUS
  Filled 2020-12-03 (×2): qty 250

## 2020-12-03 MED ORDER — FENTANYL CITRATE PF 50 MCG/ML IJ SOSY: 100.0000 ug | PREFILLED_SYRINGE | INTRAMUSCULAR | Status: DC | PRN

## 2020-12-03 MED ORDER — SODIUM BICARBONATE 8.4 % IV SOLN
100.0000 meq | Freq: Once | INTRAVENOUS | Status: AC
  Administered 2020-12-03: 100 meq via INTRAVENOUS
  Filled 2020-12-03: qty 100

## 2020-12-03 MED ORDER — SODIUM CHLORIDE 0.9% IV SOLUTION: Freq: Once | INTRAVENOUS | Status: DC

## 2020-12-03 MED ORDER — VANCOMYCIN HCL 1500 MG/300ML IV SOLN
1500.0000 mg | Freq: Once | INTRAVENOUS | Status: DC
  Filled 2020-12-03: qty 300

## 2020-12-03 MED ORDER — FENTANYL CITRATE PF 50 MCG/ML IJ SOSY: 50.0000 ug | PREFILLED_SYRINGE | Freq: Once | INTRAMUSCULAR | Status: DC

## 2020-12-03 MED ORDER — SODIUM CHLORIDE 0.9 % IV SOLN: INTRAVENOUS | Status: DC | PRN

## 2020-12-03 MED ORDER — ACETAMINOPHEN 650 MG RE SUPP: 650.0000 mg | RECTAL | Status: DC

## 2020-12-03 MED ORDER — SODIUM CHLORIDE 0.9 % IV SOLN: 250.0000 mL | INTRAVENOUS | Status: DC

## 2020-12-03 MED ORDER — POLYETHYLENE GLYCOL 3350 17 G PO PACK: 17.0000 g | PACK | Freq: Every day | ORAL | Status: DC

## 2020-12-03 MED ORDER — ALBUMIN HUMAN 25 % IV SOLN
50.0000 g | Freq: Once | INTRAVENOUS | Status: AC
  Administered 2020-12-03: 50 g via INTRAVENOUS
  Filled 2020-12-03: qty 200

## 2020-12-03 MED ORDER — PANTOPRAZOLE 80MG IVPB - SIMPLE MED
80.0000 mg | Freq: Once | INTRAVENOUS | Status: AC
  Administered 2020-12-03: 80 mg via INTRAVENOUS
  Filled 2020-12-03: qty 80

## 2020-12-03 MED ORDER — SODIUM CHLORIDE 0.9 % IV SOLN
500.0000 mg | Freq: Once | INTRAVENOUS | Status: AC
  Administered 2020-12-03: 500 mg via INTRAVENOUS
  Filled 2020-12-03: qty 500

## 2020-12-03 MED ORDER — SODIUM CHLORIDE 0.9 % IV SOLN
0.5000 ug/min | INTRAVENOUS | Status: DC
  Filled 2020-12-03: qty 10

## 2020-12-03 MED ORDER — HYDROCORTISONE SOD SUC (PF) 100 MG IJ SOLR
100.0000 mg | Freq: Three times a day (TID) | INTRAMUSCULAR | Status: DC
  Administered 2020-12-03: 100 mg via INTRAVENOUS
  Filled 2020-12-03: qty 2

## 2020-12-03 MED ORDER — ACETAMINOPHEN 325 MG PO TABS: 650.0000 mg | ORAL_TABLET | ORAL | Status: DC

## 2020-12-03 MED ORDER — DOCUSATE SODIUM 50 MG/5ML PO LIQD: 100.0000 mg | Freq: Two times a day (BID) | ORAL | Status: DC | PRN

## 2020-12-03 MED ORDER — SODIUM BICARBONATE 8.4 % IV SOLN
INTRAVENOUS | Status: DC
  Filled 2020-12-03 (×2): qty 1000

## 2020-12-03 MED ORDER — NOREPINEPHRINE 4 MG/250ML-% IV SOLN
0.0000 ug/min | INTRAVENOUS | Status: DC
  Administered 2020-12-03: 4 ug/min via INTRAVENOUS
  Administered 2020-12-03: 15 ug/min via INTRAVENOUS
  Administered 2020-12-03: 12 ug/min via INTRAVENOUS
  Administered 2020-12-03: 10 ug/min via INTRAVENOUS
  Administered 2020-12-03: 2 ug/min via INTRAVENOUS
  Administered 2020-12-03: 6 ug/min via INTRAVENOUS
  Administered 2020-12-03: 17 ug/min via INTRAVENOUS
  Filled 2020-12-03: qty 250

## 2020-12-03 MED ORDER — ONDANSETRON HCL 4 MG/2ML IJ SOLN: 4.0000 mg | Freq: Four times a day (QID) | INTRAMUSCULAR | Status: DC | PRN

## 2020-12-03 MED ORDER — DOCUSATE SODIUM 50 MG/5ML PO LIQD: 100.0000 mg | Freq: Two times a day (BID) | ORAL | Status: DC

## 2020-12-03 NOTE — Progress Notes (Signed)
RT note: RT and RN transported vent patient from ED to 2h13. Vital signs stable through out.

## 2020-12-03 NOTE — Procedures (Signed)
Central Venous Catheter Insertion Procedure Note  Tonianne Fine  [id number]  02/26/1975  Date:2020/12/18  Time:5:27 PM   Provider Performing:Brent Emrick Hensch   Procedure: Insertion of Non-tunneled Central Venous Catheter(36556) with US guidance (16606)   Indication(s) Medication administration  Consent Unable to obtain consent due to emergent nature of procedure.  Anesthesia Topical only with 1% lidocaine   Timeout Verified patient identification, verified procedure, site/side was marked, verified correct patient position, special equipment/implants available, medications/allergies/relevant history reviewed, required imaging and test results available.  Sterile Technique Maximal sterile technique including full sterile barrier drape, hand hygiene, sterile gown, sterile gloves, mask, hair covering, sterile ultrasound probe cover (if used).  Procedure Description Area of catheter insertion was cleaned with chlorhexidine and draped in sterile fashion.  With real-time ultrasound guidance a central venous catheter was placed into the right internal jugular vein. Nonpulsatile blood flow and easy flushing noted in all ports.  The catheter was sutured in place and sterile dressing applied.  Complications/Tolerance None; patient tolerated the procedure well. Chest X-ray is ordered to verify placement for internal jugular or subclavian cannulation.   Chest x-ray is not ordered for femoral cannulation.  EBL Minimal  Specimen(s) None  Heber [place], MD [facility] Pager: [phone] Cell: [phone] After 7:00 pm call Elink  [phone]

## 2020-12-03 NOTE — Progress Notes (Signed)
eLink Physician-Brief Progress Note Patient Name: Anita Harrington DOB: 02-23-1976 MRN: [id number]   Date of Service  12/23/2020  HPI/Events of Note  45 yr old alcoholic female recently stopped drinking found unresponsive at home and found by EMS in cardiac arrest.  Now in severe shock with associated acidemia and MSOF.  Ongoing bleeding from mouth and nose.  Family at bedside.  Patient is DNR.  eICU Interventions  Chart reviewed.  Resuscitation ongoing.  On high dose vasopressor support, bicarbonate drip.  Labs being repeated now including DIC panel and CMP.  Prognosis for survival poor.        Henry Russel, P 12/03/2020, 9:38 PM

## 2020-12-03 NOTE — H&P (Signed)
NAME:  Anita Harrington, MRN:  [id number], DOB:  [birth year], LOS: 0 ADMISSION DATE:  12/25/2020, CONSULTATION DATE:  10/9 REFERRING MD:  Jeraldine Loots, CHIEF COMPLAINT:  found down   History of Present Illness:  45 y/o female with no known medical history presented after a cardiac arrest at home today.  She has been drinking heavily and has problems with anxiety and ADHD.  She tried to quit drinking about 1 week ago and her husband says ever since then she has been laying around, complaining of fatigue, sleeping a lot and has been irritable.  He says that he has been brining her water and pedialyte.  She has complained of back pain, but hasn't had too many other physical complaints.  Yesterday she was irritable in the evening when he came to check on her, and he said that she slept on the cough per her usual.  She was asleep when he went to bed around 2-3 AM.  It sounds like she slid off the couch at some point in the middle of the night and he said she was still asleep when he awoke around 10 AM and went to the store.  He came back and checked on her a little after noon and she was unresponsive so he called 911.  EMS came, administered CPR, gave epinephrine. She regained a pulse after about 5-6 minutes of CPR.  In the ER she was noted to be bleeding heavily from her left nare.  Her ETT was noted to be deep so it was retracted.  She is on vasopressors and has remained unresponsive.  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 rate (!) 33, height 5\' 1"  (1.549 m), last menstrual period 11/01/2017, SpO2 100 %.    Vent Mode: PRVC FiO2 (%):  [100 %] 100 % Set Rate:  [20 bmp] 20 bmp Vt Set:  [420 mL] 420 mL PEEP:  [5 cmH20] 5 cmH20 Plateau Pressure:  [16 cmH20] 16 cmH20  No intake or output data in the 24 hours ending 12/24/2020 1602 There were no vitals filed for this visit.  Examination: General:  Chronically ill appearing, in bed on vent HENT: NCAT ETT in place, scleral icterus PULM: CTA B, vent supported breathing CV: RRR, no mgr GI: BS+, soft, nontender MSK: normal bulk and tone Derm: spider angioma Neuro: sedated on vent  12 lead EKG > personally reviewed, sinus tach, no ST wave changes  Resolved Hospital Problem list     Assessment & Plan:  Acute respiratory failure with hypoxemia due to inability to protect airway from cardiac arrest Aspiration pneumonia Ceftriaxone and azithro Resp culture Full mechanical vent support VAP prevention Daily WUA/SBT  Cardiac arrest, now in shock with severe hypoxemia Undifferentiated shock > at this point cause is unclear Tele Check trop, lactic acid, CVP, Coox Continue levophed and epinephrine titrated to MAP > 65 Monitor nose bleeding and CBC Check cortisol Start stress dose steroids Check  echocardiogram  Pancytopenia Heavy epistaxis, cannot tell if there is an upper GI bleed component to this If she has an upper GI bleed, she is too unstable for upper endoscopy Check coag stat Type and screen stat Repeat CBC Transfusion PRBC if Hgb < 7gm/dL Transfuse FFP if INR > 1.5 Transfuse PLT if bleeding and < 50K Pack nares now Afrin now Given concern for upper GI bleeding> start PPI infusion and octreotide for now, monitor OG tube output  AKI Monitor BMET and UOP Replace electrolytes as needed  Increased anion gap Metabolic acidosis > presumably lactic acidosis Check lactic acid Start sodium bicarbonate infusion given profound  shock  Alcoholic cirrhosis> diagnosed today based on physical exam findings RUQ ultrasound Check PT/INR Will start hydrocortisone for now, but may need to start prednisone if discriminant function is elevated  Acute metabolic encephalopathy > concern for anoxic brain injury, could also have hepatic encephalopathy Check ammonia TTM protocol, goal is no fever, don't let temp go over 37.5 degrees Sedation per TTM protocol  Best Practice (right click and "Reselect all SmartList Selections" daily)   Diet/type: NPO DVT prophylaxis: SCD GI prophylaxis: PPI Lines: Central line and yes and it is still needed Foley:  Yes, and it is still needed Code Status:  full code Last date of multidisciplinary goals of care discussion [10/9, husband updated bedside.  Her 1/2 brother is a Systems developer at [employer]]  Labs   CBC: Recent Labs  Lab 12/13/2020 1412  WBC 13.7*  NEUTROABS 9.0*  HGB 11.5*  HCT 35.4*  MCV 130.6*  PLT 59*    Basic Metabolic Panel: Recent Labs  Lab 13-Dec-2020 1412  NA 139  K 5.0  CL 103  CO2 13*  GLUCOSE 84  BUN 18  CREATININE 1.70*  CALCIUM 8.3*   GFR: CrCl cannot be calculated (Unknown ideal weight.). Recent Labs  Lab December 13, 2020 1412  WBC 13.7*    Liver Function Tests: Recent Labs  Lab 12-13-2020 1412  AST 274*  ALT 74*  ALKPHOS 77  BILITOT 11.2*  PROT 6.9  ALBUMIN 2.4*   No results for input(s): LIPASE, AMYLASE in the last 168 hours. No results for input(s): AMMONIA in the last 168 hours.  ABG No results found for: PHART, PCO2ART, PO2ART, HCO3, TCO2, ACIDBASEDEF, O2SAT   Coagulation Profile: No results for input(s): INR, PROTIME in the last 168 hours.  Cardiac Enzymes: No results for input(s): CKTOTAL, CKMB, CKMBINDEX, TROPONINI in the last 168 hours.  HbA1C: No results found for: HGBA1C  CBG: Recent Labs  Lab December 13, 2020 1401  GLUCAP 79    Review of Systems:   Cannot obtain due to intubation  Past Medical History:  She,  has a past  medical history of Anxiety and Hypertension.   Surgical History:   Past Surgical History:  Procedure Laterality Date   CHOLECYSTECTOMY N/A 10/19/2015   Procedure: LAPAROSCOPIC CHOLECYSTECTOMY with Intraoperative Cholangiogram;  Surgeon: Rodman Pickle, MD;  Location: [facility] OR;  Service: General;  Laterality: N/A;   GASTRIC BYPASS     OVARIAN CYST REMOVAL       Social History:   reports that she has been smoking. She has a 25.00 pack-year smoking history. She has never used smokeless tobacco. She reports that she does not drink alcohol and does not use drugs.   Family History:  Her family history includes Diabetes in her father and mother.   Allergies Allergies  Allergen Reactions   Amoxicillin     Has patient had a PCN  reaction causing immediate rash, facial/tongue/throat swelling, SOB or lightheadedness with hypotension: No Has patient had a PCN reaction causing severe rash involving mucus membranes or skin necrosis: No Has patient had a PCN reaction that required hospitalization: No Has patient had a PCN reaction occurring within the last 10 years: No If all of the above answers are "NO", then may proceed with Cephalosporin use.   Cefaclor    Ciprofloxacin Hcl    Levaquin [Levofloxacin In D5w] Rash    During childhood. Pt unsure of details.   Penicillins Rash    Has patient had a PCN reaction causing immediate rash, facial/tongue/throat swelling, SOB or lightheadedness with hypotension: No Has patient had a PCN reaction causing severe rash involving mucus membranes or skin necrosis: No Has patient had a PCN reaction that required hospitalization: No Has patient had a PCN reaction occurring within the last 10 years: No If all of the above answers are "NO", then may proceed with Cephalosporin use.      Home Medications  Prior to Admission medications   Medication Sig Start Date End Date Taking? Authorizing Provider  ALPRAZolam Prudy Feeler) 0.5 MG tablet Take 1 mg by mouth 3  (three) times daily as needed for anxiety.     [provider]  ARIPiprazole (ABILIFY) 10 MG tablet Take 10 mg by mouth daily.    [provider]  busPIRone (BUSPAR) 5 MG tablet Take 1 tablet (5 mg total) by mouth 3 (three) times daily. Need to be refilled by PCP or psychiatry. Patient not taking: Reported on 10/16/2017 06/15/17   Ofilia Neas, PA-C  chlordiazePOXIDE (LIBRIUM) 25 MG capsule Take 25 mg by mouth 2 (two) times daily. 08/31/19   [provider]  cloNIDine (CATAPRES) 0.1 MG tablet Take 0.1 mg by mouth daily. 09/03/19   [provider]  desvenlafaxine (PRISTIQ) 100 MG 24 hr tablet Take 100 mg by mouth every morning. 09/16/19   [provider]  escitalopram (LEXAPRO) 5 MG tablet Take 1 tablet (5 mg total) by mouth daily. Take with food.  Need to be refilled by PCP or psychiatry. Patient not taking: Reported on 10/16/2017 06/15/17   Ofilia Neas, PA-C  gabapentin (NEURONTIN) 300 MG capsule Take 300 mg by mouth 3 (three) times daily.    [provider]  lisdexamfetamine (VYVANSE) 30 MG capsule Take 50 mg by mouth daily.     [provider]  mupirocin ointment (BACTROBAN) 2 % Apply to rash two times a day 10/07/19   Eustace Moore, MD  nitrofurantoin, macrocrystal-monohydrate, (MACROBID) 100 MG capsule Take 1 capsule (100 mg total) by mouth 2 (two) times daily. 10/07/19   Eustace Moore, MD  phenazopyridine (PYRIDIUM) 200 MG tablet Take 1 tablet (200 mg total) by mouth 3 (three) times daily. 10/07/19   Eustace Moore, MD  REXULTI 2 MG TABS tablet Take 2 mg by mouth at bedtime. 09/12/19   [provider]  traZODone (DESYREL) 150 MG tablet Take 100 mg by mouth at bedtime.     [provider]     Critical care time: 45 minutes    Heber Catron, MD [facility] Pager: [phone] Cell: [phone] After 7:00 pm call Elink  [phone]

## 2020-12-03 NOTE — Progress Notes (Signed)
Chaplain received page from ED for family support.  [employer], changing shift, filled night Chaplain on the patient and family.  Spouse, Matt, at bedside, patient's mother in law and father in law present along with patient's brother. Patient transferred to [facility] and chaplain escorted family to the waiting area and offered support as 2H team got the patient settled in new room.  Patient and spouse have been together 4 years.  Spouse paced a lot and is very worried about his wife.  Chaplain provided empathetic/reflective listening and hospitality.  Chaplain built rapport with the family.  Chaplain connected with RN and she updated family and facilitated them going to bedside.  Chaplain available as needed and will have day chaplain connect with family. Chaplain Agustin Cree, [place].    12-19-20 2231  Clinical Encounter Type  Visited With Patient and family together;Health care provider  Visit Type Critical Care  Referral From Nurse  Consult/Referral To Chaplain  Stress Factors  Family Stress Factors Health changes

## 2020-12-03 NOTE — Progress Notes (Signed)
  Interdisciplinary Goals of Care Family Meeting   Date carried out:: 29-Dec-2020  Location of the meeting: Conference room  Member's involved: Physician, Chaplain, and Family Member or next of kin  Durable Power of Attorney or Environmental health practitioner: husband and brother    Discussion: We discussed goals of care for Anita Harrington .  Patient has multi-organ failure post cardiac arrest.  Hence Dr. Kendrick Fries updated Anita Harrington (husband) and patient's brother who is an Best boy at [employer] of the patient's condition and her poor prognosis. They have decided to continue with present therapy but to change the code status to DNR.  Patient will be re evaluated in a few hours and according to her condition, further family decision may be warranted.   Code status: Full DNR  Disposition: Continue current acute care  Time spent for the meeting: 30 minutes  Annett Fabian 12-29-20, 7:27 PM

## 2020-12-03 NOTE — ED Provider Notes (Signed)
[facility] EMERGENCY DEPARTMENT Provider Note   CSN: [id number] Arrival date & time: 17-Dec-2020  1353     History Chief Complaint  Patient presents with   Post CPR    Anita Harrington is a 45 y.o. female.  HPI Patient presents in extremis via EMS.  Per EMS the patient was last seen normal about 14 hours prior to ED arrival.  She is noted history of alcohol use, and psychiatric disease.  Per EMS the patient's husband tried to awaken her after about 14 hours following having been interactive.  After she was found to be unresponsive, EMS was notified.  Per EMS the patient was agonal initially, had bradycardia with cardiac arrest.  Patient received about 20 minutes of CPR resuscitation with return of spontaneous circulation, initiation of epinephrine drip.  No neurologic activity in route. Level 5 caveat secondary to acuity of condition.    Past Medical History:  Diagnosis Date   Anxiety    Hypertension     Patient Active Problem List   Diagnosis Date Noted   Alcohol use disorder, severe, dependence (HCC) 06/10/2017   Gallstone pancreatitis 10/17/2015   Transaminasemia 10/17/2015   Depression 10/17/2015   Tobacco abuse 10/17/2015   Pancreatitis 10/17/2015   Other acute pancreatitis     Past Surgical History:  Procedure Laterality Date   CHOLECYSTECTOMY N/A 10/19/2015   Procedure: LAPAROSCOPIC CHOLECYSTECTOMY with Intraoperative Cholangiogram;  Surgeon: Rodman Pickle, MD;  Location: [facility] OR;  Service: General;  Laterality: N/A;   GASTRIC BYPASS     OVARIAN CYST REMOVAL       OB History   No obstetric history on file.     Family History  Problem Relation Age of Onset   Diabetes Mother    Diabetes Father     Social History   Tobacco Use   Smoking status: Every Day    Packs/day: 1.00    Years: 25.00    Pack years: 25.00    Types: Cigarettes   Smokeless tobacco: Never  Vaping Use   Vaping Use: Never used  Substance Use Topics   Alcohol use:  No   Drug use: No    Home Medications Prior to Admission medications   Medication Sig Start Date End Date Taking? Authorizing Provider  ALPRAZolam Prudy Feeler) 0.5 MG tablet Take 1 mg by mouth 3 (three) times daily as needed for anxiety.     [provider]  ARIPiprazole (ABILIFY) 10 MG tablet Take 10 mg by mouth daily.    [provider]  busPIRone (BUSPAR) 5 MG tablet Take 1 tablet (5 mg total) by mouth 3 (three) times daily. Need to be refilled by PCP or psychiatry. Patient not taking: Reported on 10/16/2017 06/15/17   Ofilia Neas, PA-C  chlordiazePOXIDE (LIBRIUM) 25 MG capsule Take 25 mg by mouth 2 (two) times daily. 08/31/19   [provider]  cloNIDine (CATAPRES) 0.1 MG tablet Take 0.1 mg by mouth daily. 09/03/19   [provider]  desvenlafaxine (PRISTIQ) 100 MG 24 hr tablet Take 100 mg by mouth every morning. 09/16/19   [provider]  escitalopram (LEXAPRO) 5 MG tablet Take 1 tablet (5 mg total) by mouth daily. Take with food.  Need to be refilled by PCP or psychiatry. Patient not taking: Reported on 10/16/2017 06/15/17   Ofilia Neas, PA-C  gabapentin (NEURONTIN) 300 MG capsule Take 300 mg by mouth 3 (three) times daily.    [provider]  lisdexamfetamine (VYVANSE) 30 MG  capsule Take 50 mg by mouth daily.     [provider]  mupirocin ointment (BACTROBAN) 2 % Apply to rash two times a day 10/07/19   Eustace Moore, MD  nitrofurantoin, macrocrystal-monohydrate, (MACROBID) 100 MG capsule Take 1 capsule (100 mg total) by mouth 2 (two) times daily. 10/07/19   Eustace Moore, MD  phenazopyridine (PYRIDIUM) 200 MG tablet Take 1 tablet (200 mg total) by mouth 3 (three) times daily. 10/07/19   Eustace Moore, MD  REXULTI 2 MG TABS tablet Take 2 mg by mouth at bedtime. 09/12/19   [provider]  traZODone (DESYREL) 150 MG tablet Take 100 mg by mouth at bedtime.     [provider]    Allergies     Amoxicillin, Cefaclor, Ciprofloxacin hcl, Levaquin [levofloxacin in d5w], and Penicillins  Review of Systems   Review of Systems  Unable to perform ROS: Acuity of condition   Physical Exam Updated Vital Signs BP 101/60   Pulse 96   Temp (!) 95.9 F (35.5 C) (Axillary)   Resp (!) 34   Ht  (1.549 m)   LMP 11/01/2017   SpO2 100%   BMI 26.83 kg/m   Physical Exam Vitals and nursing note reviewed.  Constitutional:      Appearance: She is well-developed. She is ill-appearing.     Comments: Unresponsive adult female  HENT:     Head: Normocephalic and atraumatic.  Eyes:     General: Scleral icterus present.     Comments: Pupils 2 mm bilaterally, nonreactive, does not track  Cardiovascular:     Rate and Rhythm: Normal rate and regular rhythm.     Comments: Pulses appreciable Pulmonary:     Comments: Breath sounds audible with bag valve mask ventilation via endotracheal tube Abdominal:     General: There is no distension.  Musculoskeletal:        General: No deformity.     Comments: Puncture wounds in the anterior tibia bilaterally consistent with intraosseous access sites.  Skin:    General: Skin is warm and dry.     Coloration: Skin is jaundiced.  Neurological:     Cranial Nerves: No cranial nerve deficit.     Comments: Flaccid, unresponsive  Psychiatric:     Comments: Impaired    ED Results / Procedures / Treatments   Labs (all labs ordered are listed, but only abnormal results are displayed) Labs Reviewed  COMPREHENSIVE METABOLIC PANEL - Abnormal; Notable for the following components:      Result Value   CO2 13 (*)    Creatinine, Ser 1.70 (*)    Calcium 8.3 (*)    Albumin 2.4 (*)    AST 274 (*)    ALT 74 (*)    Total Bilirubin 11.2 (*)    GFR, Estimated 37 (*)    Anion gap 23 (*)    All other components within normal limits  ACETAMINOPHEN LEVEL - Abnormal; Notable for the following components:   Acetaminophen (Tylenol), Serum <10 (*)    All other  components within normal limits  CBC WITH DIFFERENTIAL/PLATELET - Abnormal; Notable for the following components:   WBC 13.7 (*)    RBC 2.71 (*)    Hemoglobin 11.5 (*)    HCT 35.4 (*)    MCV 130.6 (*)    MCH 42.4 (*)    RDW 22.2 (*)    Platelets 59 (*)    nRBC 1.6 (*)    Neutro Abs 9.0 (*)  Monocytes Absolute 1.5 (*)    Abs Immature Granulocytes 1.27 (*)    All other components within normal limits  I-STAT ARTERIAL BLOOD GAS, ED - Abnormal; Notable for the following components:   pH, Arterial 7.128 (*)    pO2, Arterial 63 (*)    Bicarbonate 12.7 (*)    TCO2 14 (*)    Acid-base deficit 15.0 (*)    Potassium 3.4 (*)    Calcium, Ion 0.94 (*)    HCT 27.0 (*)    Hemoglobin 9.2 (*)    All other components within normal limits  I-STAT ARTERIAL BLOOD GAS, ED - Abnormal; Notable for the following components:   pH, Arterial 7.160 (*)    pCO2 arterial 29.6 (*)    pO2, Arterial 247 (*)    Bicarbonate 10.6 (*)    TCO2 11 (*)    Acid-base deficit 17.0 (*)    Calcium, Ion 0.92 (*)    HCT 24.0 (*)    Hemoglobin 8.2 (*)    All other components within normal limits  RESP PANEL BY RT-PCR (FLU A&B, COVID) ARPGX2  SALICYLATE LEVEL  ETHANOL  RAPID URINE DRUG SCREEN, HOSP PERFORMED  URINALYSIS, ROUTINE W REFLEX MICROSCOPIC  BLOOD GAS, ARTERIAL  HIV ANTIBODY (ROUTINE TESTING W REFLEX)  BLOOD GAS, ARTERIAL  BLOOD GAS, ARTERIAL  PROTIME-INR  CBG MONITORING, ED  CBG MONITORING, ED  I-STAT BETA HCG BLOOD, ED ([facility], [facility], [facility] ONLY)    EKG None  Radiology DG Chest Portable 1 View  Result Date: 12/05/2020 CLINICAL DATA:  Intubated EXAM: PORTABLE CHEST 1 VIEW COMPARISON:  Chest radiograph from earlier today. FINDINGS: Endotracheal tube tip is 1.4 cm above the carina. Enteric tube terminates in the very proximal stomach with side port in the lower thoracic esophagus, recommend advancing 5 cm. Stable cardiomediastinal silhouette with top-normal heart size. No pneumothorax. Stable mild blunting  of the left costophrenic angle. No right pleural effusion. Dense upper left lung consolidation, unchanged. Stable left retrocardiac consolidation. Minimal right costophrenic angle scarring versus atelectasis. IMPRESSION: 1. Endotracheal tube tip is 1.4 cm above the carina. Consider retracting 1 cm. 2. Enteric tube terminates in the very proximal stomach with side port in the lower thoracic esophagus, recommend advancing 5 cm. 3. Stable dense upper left lung consolidation and left retrocardiac consolidation, which could represent pneumonia, atelectasis or lung mass. Chest imaging follow-up advised. Electronically Signed   By: Delbert Phenix M.D.   On: 12/12/2020 15:08   DG Chest Port 1 View  Result Date: 11/25/2020 CLINICAL DATA:  45 year old female with a history of CPR EXAM: PORTABLE CHEST 1 VIEW COMPARISON:  03/12/2017 FINDINGS: Cardiomediastinal silhouette borderline enlarged. Low lung volumes. Endotracheal tube terminates in the proximal right mainstem. Withdrawal of 5-6 cm would position this at the clavicular heads. Gastric tube terminates in the midline above the carina, uncertain position. Dense airspace opacity in the left upper lung. Opacification at the left lung base with obscuration of the left hemidiaphragm. IMPRESSION: Right mainstem intubation by the endotracheal tube. Withdrawal of 5-6 cm would position this more appropriately near the clavicular heads. Low lung volumes with dense opacity in the left upper lobe, most likely secondary to volume loss given the endotracheal tube position. Gastric tube terminates above the carina, uncertain whether in the airway or esophagus. These results discussed by telephone at the time of interpretation on 11/25/2020 at 2:49 pm with Dr. Gerhard Munch, Electronically Signed   By: Gilmer Mor D.O.   On: 11/27/2020 14:49    Procedures  Procedures   Medications Ordered in ED Medications  fentaNYL (SUBLIMAZE) injection 100 mcg (has no administration in time  range)  fentaNYL (SUBLIMAZE) injection 100 mcg (has no administration in time range)  midazolam (VERSED) injection 2 mg (has no administration in time range)  midazolam (VERSED) injection 2 mg (has no administration in time range)  norepinephrine (LEVOPHED) 4mg  in premix infusion (17 mcg/min Intravenous New Bag/Given 12/01/2020 1543)  EPINEPHrine (ADRENALIN) 5 mg in NS 250 mL (0.02 mg/mL) premix infusion (12 mcg/min Intravenous New Bag/Given 12/13/2020 1503)  aztreonam (AZACTAM) 2 g in sodium chloride 0.9 % 100 mL IVPB (has no administration in time range)  docusate (COLACE) 50 MG/5ML liquid 100 mg (has no administration in time range)  polyethylene glycol (MIRALAX / GLYCOLAX) packet 17 g (has no administration in time range)  ondansetron (ZOFRAN) injection 4 mg (has no administration in time range)  acetaminophen (TYLENOL) tablet 650 mg (has no administration in time range)    Or  acetaminophen (TYLENOL) 160 MG/5ML solution 650 mg (has no administration in time range)    Or  acetaminophen (TYLENOL) suppository 650 mg (has no administration in time range)  magnesium sulfate IVPB 2 g 50 mL (has no administration in time range)  norepinephrine (LEVOPHED) 4mg  in 02/02/21 premix infusion (has no administration in time range)  0.9 %  sodium chloride infusion (has no administration in time range)  pantoprazole (PROTONIX) injection 40 mg (has no administration in time range)  fentaNYL (SUBLIMAZE) injection 50 mcg (has no administration in time range)  fentaNYL in NS (42mcg/ml) infusion-PREMIX (has no administration in time range)  fentaNYL (SUBLIMAZE) bolus via infusion 50-100 mcg (has no administration in time range)  oxymetazoline (AFRIN) 0.05 % nasal spray 4-8 spray (has no administration in time range)  pantoprazole (PROTONIX) 80 mg /NS 100 mL IVPB (has no administration in time range)  pantoprozole (PROTONIX) 80 mg /NS 100 mL infusion (has no administration in time range)   pantoprazole (PROTONIX) injection 40 mg (has no administration in time range)  octreotide (SANDOSTATIN) 500 mcg in sodium chloride 0.9 % 250 mL (2 mcg/mL) infusion (has no administration in time range)    ED Course  I have reviewed the triage vital signs and the nursing notes.  Pertinent labs & imaging results that were available during my care of the patient were reviewed by me and considered in my medical decision making (see chart for details).  Patient transferred to continuous cardiac monitoring pulse oximetry on arrival, assessment of endotracheal tube with good aeration.  Patient started on Levophed after her blood pressure remained low in spite of epinephrine.    Bedside x-ray demonstrates endotracheal tube is deep, this was withdrawn.  I discussed her case with her husband who confirmed HPI as above, adding that the patient had recently stopped drinking alcohol in an effort to achieve sobriety.  He is unaware of things such as suicidal intent. He states that the patient was in her usual health until he found this morning.  Update: I discussed her case with our critical care team. MDM Rules/Calculators/A&P                         Adult female presents in extremis after cardiac arrest.  Patient's history of alcohol use jaundice on exam, unresponsive status all suggestive of hepatobiliary dysfunction either pre or post arrest.  No evidence for trauma.  Patient required initiation of additional pressors after arrival to the ED, adjustment  of her endotracheal tube.  On signout the patient is awaiting CT head to exclude additional pathology.  Patient in critical care, transferred to our intensivist team for ongoing monitoring, management.  MDM Number of Diagnoses or Management Options Cardiac arrest [facility]): new, needed workup   Amount and/or Complexity of Data Reviewed Clinical lab tests: ordered and reviewed Tests in the radiology section of CPT: ordered and reviewed Tests in the  medicine section of CPT: ordered and reviewed Decide to obtain previous medical records or to obtain history from someone other than the patient: yes Obtain history from someone other than the patient: yes Review and summarize past medical records: yes Discuss the patient with other providers: yes Independent visualization of images, tracings, or specimens: yes  Risk of Complications, Morbidity, and/or Mortality Presenting problems: high Diagnostic procedures: high Management options: high  Critical Care Total time providing critical care: 30-74 minutes (40)  Patient Progress Patient progress: other (comment) (Critically ill)   Final Clinical Impression(s) / ED Diagnoses Final diagnoses:  Cardiac arrest [facility])     Gerhard Munch, MD 12/02/2020 1626

## 2020-12-03 NOTE — Progress Notes (Signed)
LTM EEG setup at bedside. [facility] called. Impedance good on leads. Not MRI leads used. Head glued, taped, and circumference wrapped. Nurse instructed about how to move patient to next room assignment.

## 2020-12-03 NOTE — ED Notes (Signed)
Spoke with Dr. Kendrick Fries and made him aware that the patients nose was still bleeding after the afrin per RN Camry[place] request, states he will come down to evaluate.

## 2020-12-03 NOTE — ED Triage Notes (Signed)
Pt BIB [facility] from home. LKW last PM. Known ETOH/smoking unknown substance last PM. Recent c/o back pain. Hx ETOH abuse. Agonal respirations with fire. Witnessed arrest with EMS. Given 1 epi total, placed on epi gtt with EMS. 1 cycle CPR. 20-25 min total time from fire arrival. Bagging on arrival. 6.0 tube placed by EMS.

## 2020-12-03 NOTE — Progress Notes (Addendum)
Patient actively dying. Spoke with elink MD and in house physician, all agree to not escalate care at this time. Family is at bedside and aware of the severity of the situation. Brother and husband are enjoying stories that bring peace and saying their goodbyes.  We will not escalate care at this time.

## 2020-12-03 NOTE — Procedures (Signed)
Patient Name: Blue Ruggerio  MRN: [id number]  Epilepsy Attending: Charlsie Quest  Referring Physician/Provider: Dr Max Fickle Date: 12/23/20 Duration: 21.20 mins  Patient history: 45yo F s/p cardiac arrest. EEG to evaluate for seizure.   Level of alertness: comatose  AEDs during EEG study: Propofol  Technical aspects: This EEG study was done with scalp electrodes positioned according to the 10-20 International system of electrode placement. Electrical activity was acquired at a sampling rate of 500Hz  and reviewed with a high frequency filter of 70Hz  and a low frequency filter of 1Hz . EEG data were recorded continuously and digitally stored.   Description: EEG showed continuous generalized low amplitude 2-3 Hz delta slowing. Eeg was reactive to noxious stimulation.  Hyperventilation and photic stimulation were not performed.     ABNORMALITY - Continuous slow, generalized  IMPRESSION: This study is suggestive of severe diffuse encephalopathy, nonspecific etiology. No seizures or epileptiform discharges were seen throughout the recording.  Yannely Kintzel 

## 2020-12-03 NOTE — Progress Notes (Signed)
Pt art line reading 60 sys, MAP upper 40's, cuff reading corroborates this via Dynamap. Epi and Norepi gtts maxed. Family updated.  Per Dr Peggye Pitt, plan is not to escalate, eLink team providing confirmation of this plan of care.

## 2020-12-03 NOTE — Progress Notes (Signed)
Pharmacy Antibiotic Note  Anita Harrington is a 45 y.o. female admitted on 22-Dec-2020 with pneumonia.  Pharmacy has been consulted for vancomycin dosing.  Patient with a history of EtOH dependence, anxiety, htn. Patient presenting as a post-arrest patient.  SCr 1.7 WBC 13.7 COVID neg  Plan: Rocephin and azithromycin per MD entered Vancomycin 1500 mg once, subsequent dosing as indicated per random vancomycin level until renal function stable and/or improved, at which time scheduled dosing can be considered Follow-up cultures and de-escalate antibiotics as appropriate.  Height: 5\' 1"  (154.9 cm) IBW/kg (Calculated) : 47.8  Temp (24hrs), Avg:95.9 F (35.5 C), Min:95.9 F (35.5 C), Max:95.9 F (35.5 C)  Recent Labs  Lab 12/22/20 1412  WBC 13.7*  CREATININE 1.70*    CrCl cannot be calculated (Unknown ideal weight.).    Allergies  Allergen Reactions   Amoxicillin     Has patient had a PCN reaction causing immediate rash, facial/tongue/throat swelling, SOB or lightheadedness with hypotension: No Has patient had a PCN reaction causing severe rash involving mucus membranes or skin necrosis: No Has patient had a PCN reaction that required hospitalization: No Has patient had a PCN reaction occurring within the last 10 years: No If all of the above answers are "NO", then may proceed with Cephalosporin use.   Cefaclor    Ciprofloxacin Hcl    Levaquin [Levofloxacin In D5w] Rash    During childhood. Pt unsure of details.   Penicillins Rash    Has patient had a PCN reaction causing immediate rash, facial/tongue/throat swelling, SOB or lightheadedness with hypotension: No Has patient had a PCN reaction causing severe rash involving mucus membranes or skin necrosis: No Has patient had a PCN reaction that required hospitalization: No Has patient had a PCN reaction occurring within the last 10 years: No If all of the above answers are "NO", then may proceed with Cephalosporin use.     Antimicrobials this admission: Rocephin 10/9 >>  Azithromycin 10/9 >>  vancomycin 10/9 >>   Microbiology results: Pending  Thank you for allowing pharmacy to be a part of this patient's care.  12/9, PharmD, BCPS December 22, 2020 4:38 PM ED Clinical Pharmacist -  [phone]

## 2020-12-03 NOTE — Procedures (Signed)
Arterial Catheter Insertion Procedure Note  Francess Mullen  [id number]  08-07-1975  Date:2020/12/09  Time:5:03 PM    Provider Performing: Dewain Penning T    Procedure: Insertion of Arterial Line (50277) without US guidance  Indication(s) Blood pressure monitoring and/or need for frequent ABGs  Consent Unable to obtain consent due to emergent nature of procedure.  Anesthesia None   Time Out Verified patient identification, verified procedure, site/side was marked, verified correct patient position, special equipment/implants available, medications/allergies/relevant history reviewed, required imaging and test results available.   Sterile Technique Maximal sterile technique including full sterile barrier drape, hand hygiene, sterile gown, sterile gloves, mask, hair covering, sterile ultrasound probe cover (if used).   Procedure Description Area of catheter insertion was cleaned with chlorhexidine and draped in sterile fashion. Without real-time ultrasound guidance an arterial catheter was placed into the left radial artery.  Appropriate arterial tracings confirmed on monitor.     Complications/Tolerance None; patient tolerated the procedure well.   EBL Minimal   Specimen(s) None

## 2020-12-03 NOTE — Progress Notes (Signed)
EEG done at bedside. Tech hard setup due to Blood pressure levels, no propofol, and excessive blood around head area. Impedance= good. Dr. was texted. Results pending.

## 2020-12-03 NOTE — Progress Notes (Signed)
Chaplain responded to page to offer support to pt's husband who was distraught over wife's condition.  Chaplain offered ministry of care and concern.  Husband highly excited and adverse to contact; not wanting anyone too close; moving from one side of bed to foot to other side.    Chaplain accompanied husband to physician consult and escorted him back to his wife's bedside.   Husband asked for privacy.  Chaplain available as needed for further support.  Vernell Morgans Chaplain

## 2020-12-03 NOTE — Progress Notes (Signed)
[facility]  Called back to bedside for evaluation of epistaxis from left nare  Bleeding again from L nare, I exchanged the left nare packing and replaced with a large rhino rocket, inflated with 1.5 cc air in posterior and anterior ports, bleeding stopped  Overall condition is worse Shock state worsening, bleeding (from nose) and metabolic acidosis both impacting this  Plan: Albumin 50gm now Sodium bicarbonate pushes now (2 amp) Start sodium bicarbonate infusion now Transfuse 1 U PRBC stat Transfuse 2 U FFP stat Serial CBC after transfusion  Will update family: husband Anita Harrington is present at bedside.  Very emotionally distraught from the situation.  Her 1/2 brother is an oncologist who is on the way.  Will discuss overall prognosis with them and code status.  While I'm hopefull with quick correction of her acid base status and volume replacement her shock and overall condition will improve, it is clear that her multi-organ failure continues to worsen despite aggressive measures.    Additional cc time 35 minutes  [name], MD [facility] Pager: [phone] Cell: [phone] After 7:00 pm call Elink  [phone]

## 2020-12-04 ENCOUNTER — Inpatient Hospital Stay (HOSPITAL_COMMUNITY): Payer: BC Managed Care – PPO

## 2020-12-04 DIAGNOSIS — I469 Cardiac arrest, cause unspecified: Secondary | ICD-10-CM | POA: Diagnosis not present

## 2020-12-04 LAB — TYPE AND SCREEN
ABO/RH(D): O NEG
Antibody Screen: NEGATIVE
Unit division: 0

## 2020-12-04 LAB — PREPARE FRESH FROZEN PLASMA: Unit division: 0

## 2020-12-04 LAB — BPAM FFP
Blood Product Expiration Date: 202210142359
Blood Product Expiration Date: 202210142359
ISSUE DATE / TIME: 202210091917
ISSUE DATE / TIME: 202210091917
Unit Type and Rh: 5100
Unit Type and Rh: 9500

## 2020-12-04 LAB — BPAM RBC
Blood Product Expiration Date: 202210162359
ISSUE DATE / TIME: 202210091918
Unit Type and Rh: 9500

## 2020-12-05 LAB — PROTIME-INR
INR: 8.8 (ref 0.8–1.2)
Prothrombin Time: 72.3 seconds — ABNORMAL HIGH (ref 11.4–15.2)

## 2020-12-26 NOTE — Procedures (Signed)
Patient Name: Anita Harrington  MRN: [id number]  Epilepsy Attending: Charlsie Quest  Referring Physician/Provider: Dr Max Fickle Duration: December 27, 2020 1854 to 2032   Patient history: 45yo F s/p cardiac arrest. EEG to evaluate for seizure.    Level of alertness: comatose   AEDs during EEG study: Propofol   Technical aspects: This EEG study was done with scalp electrodes positioned according to the 10-20 International system of electrode placement. Electrical activity was acquired at a sampling rate of 500Hz  and reviewed with a high frequency filter of 70Hz  and a low frequency filter of 1Hz . EEG data were recorded continuously and digitally stored.    Description: EEG showed continuous generalized low amplitude 2-3 Hz delta slowing. Eeg was reactive to noxious stimulation.  Hyperventilation and photic stimulation were not performed.      ABNORMALITY - Continuous slow, generalized   IMPRESSION: This study is suggestive of severe diffuse encephalopathy, nonspecific etiology. No seizures or epileptiform discharges were seen throughout the recording.   Anita Harrington 

## 2020-12-26 NOTE — Death Summary Note (Signed)
DEATH SUMMARY   Patient Details  Name: Anita Harrington MRN: [id number] DOB: May 04, 1975  Admission/Discharge Information   Admit Date:  12/31/20  Date of Death: Date of Death: Jan 01, 2021  Time of Death: Time of Death: 0237  Length of Stay: 1  Referring Physician: Audelia Acton, MD   Reason(s) for Hospitalization  Cardiac arrest  Diagnoses  Preliminary cause of death:  Alcoholic Cirrhosis Secondary Diagnoses (including complications and co-morbidities):  Active Problems:   Alcoholic cirrhosis (HCC)   Acute respiratory failure with hypoxemia (HCC)   Cardiac arrest (HCC)   Acute metabolic encephalopathy Anoxic brain injury Thrombocytopenia Macrocytic Anemia Shock liver Cardiogenic shock Hemorrhagic shock epistaxis  Brief Hospital Course (including significant findings, care, treatment, and services provided and events leading to death)  Anita Harrington is a 45 y.o. year old female who presented to [facility] on 10/12 after being found down by her husband.  She has been drinking heavily and has problems with anxiety and ADHD.  She tried to quit drinking about 1 week ago and her husband says ever since then she has been laying around, complaining of fatigue, sleeping a lot and has been irritable.  He says that he has been brining her water and pedialyte.  She has complained of back pain, but hasn't had too many other physical complaints.  Yesterday she was irritable in the evening when he came to check on her, and he said that she slept on the cough per her usual.  She was asleep when he went to bed around 2-3 AM.  It sounds like she slid off the couch at some point in the middle of the night and he said she was still asleep when he awoke around 10 AM and went to the store.  He came back and checked on her a little after noon and she was unresponsive so he called 911.  EMS came, administered CPR, gave epinephrine. She regained a pulse after about 5-6 minutes of CPR.  In the ER she was noted to be  bleeding heavily from her left nare.  Her ETT was noted to be deep so it was retracted.  She is on vasopressors and has remained unresponsive.  After arriving in the ER she remained in shock requiring high doses of vasopressors including epinephrine and norepinephrine.  She received stress dose steroids, PRBC transfusion and FFP.  She continued to bleed heavily from her nose (thought to be a consequence of bag mask ventilation or trauma from falling off the couch).  She was unresponsive for the duration of the stay in the ER and her EEG reading was suggestive of severe diffuse encephalopathy.    Despite aggressive interventions with IV fluids, vasopressors, and blood product administration the patient's blood pressure remained very low.  We discussed the situation with her husband and recommended that we change her code status to DNR, he agreed.  However we continued aggressive resuscitation but ultimately she died from cardiogenic and hemorrhagic shock which were a consequence of her alcoholic cirrhosis.    Pertinent Labs and Studies  Significant Diagnostic Studies DG Chest Portable 1 View  Result Date: 2020/12/31 CLINICAL DATA:  Central line placement EXAM: PORTABLE CHEST 1 VIEW COMPARISON:  12/31/2020 FINDINGS: Right central line tip in the SVC. No pneumothorax. Endotracheal tube and NG tube are unchanged. Heart is normal size. Improved aeration in the left lung with continued left lower lung airspace disease. Right basilar atelectasis or infiltrate has increased. IMPRESSION: Right central line tip in the SVC.  No  pneumothorax. Improved aeration in the left upper lobe bilateral lower lobe airspace opacities, increasing on the right since prior study Electronically Signed   By: Charlett Nose M.D.   On: 12/18/2020 17:07   DG Chest Portable 1 View  Result Date: 12/10/2020 CLINICAL DATA:  Intubated EXAM: PORTABLE CHEST 1 VIEW COMPARISON:  Chest radiograph from earlier today. FINDINGS: Endotracheal tube  tip is 1.4 cm above the carina. Enteric tube terminates in the very proximal stomach with side port in the lower thoracic esophagus, recommend advancing 5 cm. Stable cardiomediastinal silhouette with top-normal heart size. No pneumothorax. Stable mild blunting of the left costophrenic angle. No right pleural effusion. Dense upper left lung consolidation, unchanged. Stable left retrocardiac consolidation. Minimal right costophrenic angle scarring versus atelectasis. IMPRESSION: 1. Endotracheal tube tip is 1.4 cm above the carina. Consider retracting 1 cm. 2. Enteric tube terminates in the very proximal stomach with side port in the lower thoracic esophagus, recommend advancing 5 cm. 3. Stable dense upper left lung consolidation and left retrocardiac consolidation, which could represent pneumonia, atelectasis or lung mass. Chest imaging follow-up advised. Electronically Signed   By: Delbert Phenix M.D.   On: 12/09/2020 15:08   DG Chest Port 1 View  Result Date: 11/26/2020 CLINICAL DATA:  45 year old female with a history of CPR EXAM: PORTABLE CHEST 1 VIEW COMPARISON:  03/12/2017 FINDINGS: Cardiomediastinal silhouette borderline enlarged. Low lung volumes. Endotracheal tube terminates in the proximal right mainstem. Withdrawal of 5-6 cm would position this at the clavicular heads. Gastric tube terminates in the midline above the carina, uncertain position. Dense airspace opacity in the left upper lung. Opacification at the left lung base with obscuration of the left hemidiaphragm. IMPRESSION: Right mainstem intubation by the endotracheal tube. Withdrawal of 5-6 cm would position this more appropriately near the clavicular heads. Low lung volumes with dense opacity in the left upper lobe, most likely secondary to volume loss given the endotracheal tube position. Gastric tube terminates above the carina, uncertain whether in the airway or esophagus. These results discussed by telephone at the time of interpretation on  11/25/2020 at 2:49 pm with Dr. Gerhard Munch, Electronically Signed   By: Gilmer Mor D.O.   On: 11/30/2020 14:49   EEG adult  Result Date: 12/02/2020 Charlsie Quest, MD     12/06/2020  7:26 PM Patient Name: Anita Harrington MRN: [id number] Epilepsy Attending: Charlsie Quest Referring Physician/Provider: Dr Max Fickle Date: 11/26/2020 Duration: 21.20 mins Patient history: 44yo F s/p cardiac arrest. EEG to evaluate for seizure. Level of alertness: comatose AEDs during EEG study: Propofol Technical aspects: This EEG study was done with scalp electrodes positioned according to the 10-20 International system of electrode placement. Electrical activity was acquired at a sampling rate of 500Hz  and reviewed with a high frequency filter of 70Hz  and a low frequency filter of 1Hz . EEG data were recorded continuously and digitally stored. Description: EEG showed continuous generalized low amplitude 2-3 Hz delta slowing. Eeg was reactive to noxious stimulation.  Hyperventilation and photic stimulation were not performed.   ABNORMALITY - Continuous slow, generalized IMPRESSION: This study is suggestive of severe diffuse encephalopathy, nonspecific etiology. No seizures or epileptiform discharges were seen throughout the recording. Priyanka   Overnight EEG with video  Result Date: January 03, 2021 , MD     01/03/21  8:57 AM Patient Name: Anita Harrington MRN: Charlsie Quest Epilepsy Attending: 02/03/2021 Referring Physician/Provider: Dr Danielle Dess Duration: 12/06/2020 1854 to 2032  Patient history: 44yo F  s/p cardiac arrest. EEG to evaluate for seizure.  Level of alertness: comatose  AEDs during EEG study: Propofol  Technical aspects: This EEG study was done with scalp electrodes positioned according to the 10-20 International system of electrode placement. Electrical activity was acquired at a sampling rate of 500Hz  and reviewed with a high frequency filter of 70Hz  and a low frequency filter of  1Hz . EEG data were recorded continuously and digitally stored.  Description: EEG showed continuous generalized low amplitude 2-3 Hz delta slowing. Eeg was reactive to noxious stimulation.  Hyperventilation and photic stimulation were not performed.    ABNORMALITY - Continuous slow, generalized  IMPRESSION: This study is suggestive of severe diffuse encephalopathy, nonspecific etiology. No seizures or epileptiform discharges were seen throughout the recording.  Priyanka   Abdomen Limited RUQ (LIVER/GB)  Result Date: 12/12/2020 CLINICAL DATA:  Cirrhosis, jaundice EXAM: ULTRASOUND ABDOMEN LIMITED RIGHT UPPER QUADRANT COMPARISON:  10/17/2015 FINDINGS: Gallbladder: Cholecystectomy. Common bile duct: Diameter: 5 mm Liver: Heterogeneous increased liver echotexture and nodularity of the liver capsule compatible with cirrhosis. No focal parenchymal abnormality. No intrahepatic duct dilation. Portal vein is patent on color Doppler imaging with normal direction of blood flow towards the liver. Other: Small volume ascites right upper quadrant. IMPRESSION: 1. Cirrhosis.  No focal parenchymal liver abnormality. 2. Small volume ascites. Electronically Signed   By: [place] M.D.   On: 11/26/2020 20:21    Microbiology Recent Results (from the past 240 hour(s))  Resp Panel by RT-PCR (Flu A&B, Covid) Nasopharyngeal Swab     Status: None   Collection Time: 11/30/2020  3:07 PM   Specimen: Nasopharyngeal Swab; Nasopharyngeal(NP) swabs in vial transport medium  Result Value Ref Range Status   SARS Coronavirus 2 by RT PCR NEGATIVE NEGATIVE Final    Comment: (NOTE) SARS-CoV-2 target nucleic acids are NOT DETECTED.  The SARS-CoV-2 RNA is generally detectable in upper respiratory specimens during the acute phase of infection. The lowest concentration of SARS-CoV-2 viral copies this assay can detect is 138 copies/mL. A negative result does not preclude SARS-Cov-2 infection and should not be used as the sole  basis for treatment or other patient management decisions. A negative result may occur with  improper specimen collection/handling, submission of specimen other than nasopharyngeal swab, presence of viral mutation(s) within the areas targeted by this assay, and inadequate number of viral copies(<138 copies/mL). A negative result must be combined with clinical observations, patient history, and epidemiological information. The expected result is Negative.  Fact Sheet for Patients:  Shar[place]  Fact Sheet for Healthcare Providers:  02/02/2021  This test is no t yet approved or cleared by the 02/02/21 FDA and  has been authorized for detection and/or diagnosis of SARS-CoV-2 by FDA under an Emergency Use Authorization (EUA). This EUA will remain  in effect (meaning this test can be used) for the duration of the COVID-19 declaration under Section 564(b)(1) of the Act, 21 U.S.C.section 360bbb-3(b)(1), unless the authorization is terminated  or revoked sooner.       Influenza A by PCR NEGATIVE NEGATIVE Final   Influenza B by PCR NEGATIVE NEGATIVE Final    Comment: (NOTE) The Xpert Xpress SARS-CoV-2/FLU/RSV plus assay is intended as an aid in the diagnosis of influenza from Nasopharyngeal swab specimens and should not be used as a sole basis for treatment. Nasal washings and aspirates are unacceptable for Xpert Xpress SARS-CoV-2/FLU/RSV testing.  Fact Sheet for Patients: BloggerCourse.com  Fact Sheet for Healthcare Providers: SeriousBroker.it  This test  is not yet approved or cleared by the [place] and has been authorized for detection and/or diagnosis of SARS-CoV-2 by FDA under an Emergency Use Authorization (EUA). This EUA will remain in effect (meaning this test can be used) for the duration of the COVID-19 declaration under Section 564(b)(1) of the Act,  21 U.S.C. section 360bbb-3(b)(1), unless the authorization is terminated or revoked.  Performed at [facility], [address] [place], [place] [address]     Lab Basic Metabolic Panel: Recent Labs  Lab 12/22/2020 1412 12/25/2020 1605 12/13/2020 1615 11/29/2020 1702 11/26/2020 2059 12/14/2020 2108 12/12/2020 2130  NA 139 142 143 142 143  --  143  K 5.0 3.4* 3.5 3.7 4.0  --  4.1  CL 103  --   --   --   --   --  107  CO2 13*  --   --   --   --   --  8*  GLUCOSE 84  --   --   --   --   --  179*  BUN 18  --   --   --   --   --  18  CREATININE 1.70*  --   --   --   --   --  1.49*  CALCIUM 8.3*  --   --   --   --   --  6.6*  MG  --   --   --   --   --  2.6*  --    Liver Function Tests: Recent Labs  Lab 11/30/2020 1412 12/19/2020 2130  AST 274* 250*  ALT 74* 63*  ALKPHOS 77 40  BILITOT 11.2* 5.7*  PROT 6.9 4.1*  ALBUMIN 2.4* 1.9*   No results for input(s): LIPASE, AMYLASE in the last 168 hours. No results for input(s): AMMONIA in the last 168 hours. CBC: Recent Labs  Lab 12/08/2020 1412 11/29/2020 1605 11/26/2020 1615 12/20/2020 1702 12/06/2020 2059 11/26/2020 2108  WBC 13.7*  --   --   --   --  16.8*  NEUTROABS 9.0*  --   --   --   --   --   HGB 11.5* 9.2* 8.2* 7.5* 7.1* 7.2*  HCT 35.4* 27.0* 24.0* 22.0* 21.0* 23.1*  MCV 130.6*  --   --   --   --  127.6*  PLT 59*  --   --   --   --  32*   Cardiac Enzymes: No results for input(s): CKTOTAL, CKMB, CKMBINDEX, TROPONINI in the last 168 hours. Sepsis Labs: Recent Labs  Lab 11/29/2020 1412 11/27/2020 1858 11/29/2020 2108  WBC 13.7*  --  16.8*  LATICACIDVEN  --  >9.0*  --     Procedures/Operations  ETT R IJ CVL   [employer] 12/06/2020, 8:25 AM

## 2020-12-26 DEATH — deceased
